# Patient Record
Sex: Female | Born: 1960 | Race: Black or African American | Hispanic: No | Marital: Married | Smoking: Current every day smoker
Health system: Southern US, Community
[De-identification: ages and names within clinical notes are randomized; demographics above are authoritative.]

## PROBLEM LIST (undated history)

## (undated) DIAGNOSIS — M5136 Other intervertebral disc degeneration, lumbar region: Secondary | ICD-10-CM

## (undated) DIAGNOSIS — M51369 Other intervertebral disc degeneration, lumbar region without mention of lumbar back pain or lower extremity pain: Secondary | ICD-10-CM

## (undated) DIAGNOSIS — F32A Depression, unspecified: Secondary | ICD-10-CM

## (undated) DIAGNOSIS — M1990 Unspecified osteoarthritis, unspecified site: Secondary | ICD-10-CM

## (undated) DIAGNOSIS — G43909 Migraine, unspecified, not intractable, without status migrainosus: Secondary | ICD-10-CM

## (undated) DIAGNOSIS — R609 Edema, unspecified: Secondary | ICD-10-CM

## (undated) DIAGNOSIS — I10 Essential (primary) hypertension: Secondary | ICD-10-CM

## (undated) DIAGNOSIS — F329 Major depressive disorder, single episode, unspecified: Secondary | ICD-10-CM

## (undated) DIAGNOSIS — J189 Pneumonia, unspecified organism: Secondary | ICD-10-CM

## (undated) DIAGNOSIS — J45909 Unspecified asthma, uncomplicated: Secondary | ICD-10-CM

## (undated) DIAGNOSIS — Z87442 Personal history of urinary calculi: Secondary | ICD-10-CM

## (undated) DIAGNOSIS — G709 Myoneural disorder, unspecified: Secondary | ICD-10-CM

## (undated) HISTORY — PX: ABDOMINAL HYSTERECTOMY: SHX81

## (undated) HISTORY — DX: Myoneural disorder, unspecified: G70.9

## (undated) HISTORY — DX: Other intervertebral disc degeneration, lumbar region: M51.36

## (undated) HISTORY — DX: Other intervertebral disc degeneration, lumbar region without mention of lumbar back pain or lower extremity pain: M51.369

## (undated) HISTORY — PX: LITHOTRIPSY: SUR834

## (undated) HISTORY — DX: Migraine, unspecified, not intractable, without status migrainosus: G43.909

## (undated) HISTORY — DX: Unspecified osteoarthritis, unspecified site: M19.90

## (undated) HISTORY — PX: OTHER SURGICAL HISTORY: SHX169

---

## 1996-11-21 HISTORY — PX: TUBAL LIGATION: SHX77

## 1998-11-21 DIAGNOSIS — Z87442 Personal history of urinary calculi: Secondary | ICD-10-CM

## 1998-11-21 HISTORY — DX: Personal history of urinary calculi: Z87.442

## 2005-05-19 ENCOUNTER — Ambulatory Visit: Payer: Self-pay | Admitting: Unknown Physician Specialty

## 2007-07-09 ENCOUNTER — Emergency Department: Payer: Self-pay | Admitting: Emergency Medicine

## 2007-12-13 ENCOUNTER — Emergency Department: Payer: Self-pay | Admitting: Emergency Medicine

## 2008-01-29 ENCOUNTER — Emergency Department: Payer: Self-pay | Admitting: Internal Medicine

## 2008-06-26 ENCOUNTER — Ambulatory Visit: Payer: Self-pay | Admitting: Internal Medicine

## 2010-11-16 ENCOUNTER — Ambulatory Visit: Payer: Self-pay | Admitting: Internal Medicine

## 2012-03-05 ENCOUNTER — Ambulatory Visit: Payer: Self-pay | Admitting: Obstetrics and Gynecology

## 2012-09-24 ENCOUNTER — Ambulatory Visit: Payer: Self-pay | Admitting: Urology

## 2013-02-28 ENCOUNTER — Ambulatory Visit: Payer: Self-pay | Admitting: Family Medicine

## 2013-06-17 ENCOUNTER — Emergency Department: Payer: Self-pay | Admitting: Emergency Medicine

## 2013-06-17 LAB — COMPREHENSIVE METABOLIC PANEL
Calcium, Total: 9.8 mg/dL (ref 8.5–10.1)
Chloride: 104 mmol/L (ref 98–107)
Creatinine: 1.03 mg/dL (ref 0.60–1.30)
EGFR (African American): >60
Glucose: 114 mg/dL — ABNORMAL HIGH (ref 65–99)
Potassium: 4 mmol/L (ref 3.5–5.1)
SGOT(AST): 35 U/L (ref 15–37)

## 2013-06-17 LAB — LIPASE, BLOOD: Lipase: 154 U/L (ref 73–393)

## 2013-06-17 LAB — URINALYSIS, COMPLETE
Bilirubin,UR: NEGATIVE
Ketone: NEGATIVE
Leukocyte Esterase: NEGATIVE
Nitrite: NEGATIVE
Ph: 5 (ref 4.5–8.0)
Specific Gravity: 1.012 (ref 1.003–1.030)
Squamous Epithelial: 4
WBC UR: 4 /HPF (ref 0–5)

## 2013-06-17 LAB — CBC
HCT: 39.3 % (ref 35.0–47.0)
MCH: 30.2 pg (ref 26.0–34.0)
MCV: 91 fL (ref 80–100)
Platelet: 302 10*3/uL (ref 150–440)
RBC: 4.31 10*6/uL (ref 3.80–5.20)

## 2013-06-18 LAB — URINE CULTURE

## 2013-10-30 ENCOUNTER — Ambulatory Visit: Payer: Self-pay | Admitting: Internal Medicine

## 2013-11-12 ENCOUNTER — Ambulatory Visit: Payer: Self-pay | Admitting: Internal Medicine

## 2014-04-25 ENCOUNTER — Emergency Department: Payer: Self-pay | Admitting: Emergency Medicine

## 2014-04-25 LAB — URINALYSIS, COMPLETE
Bilirubin,UR: NEGATIVE
Blood: NEGATIVE
Glucose,UR: NEGATIVE mg/dL (ref 0–75)
Ketone: NEGATIVE
LEUKOCYTE ESTERASE: NEGATIVE
Nitrite: NEGATIVE
Ph: 6 (ref 4.5–8.0)
Protein: 30
RBC,UR: 1 /HPF (ref 0–5)
SPECIFIC GRAVITY: 1.012 (ref 1.003–1.030)
WBC UR: 3 /HPF (ref 0–5)

## 2014-08-11 ENCOUNTER — Emergency Department: Payer: Self-pay | Admitting: General Practice

## 2014-12-25 ENCOUNTER — Ambulatory Visit: Payer: Self-pay | Admitting: Internal Medicine

## 2016-09-12 ENCOUNTER — Other Ambulatory Visit: Payer: Self-pay | Admitting: Internal Medicine

## 2016-09-12 DIAGNOSIS — Z1231 Encounter for screening mammogram for malignant neoplasm of breast: Secondary | ICD-10-CM

## 2016-10-21 ENCOUNTER — Ambulatory Visit: Payer: Self-pay

## 2016-11-25 ENCOUNTER — Ambulatory Visit: Payer: Self-pay | Attending: Internal Medicine

## 2016-12-28 ENCOUNTER — Encounter: Payer: Self-pay | Admitting: Radiology

## 2016-12-28 ENCOUNTER — Ambulatory Visit
Admission: RE | Admit: 2016-12-28 | Discharge: 2016-12-28 | Disposition: A | Discharge status: Home / Self Care | Payer: 59 | Source: Ambulatory Visit | Attending: Internal Medicine | Admitting: Internal Medicine

## 2016-12-28 DIAGNOSIS — R928 Other abnormal and inconclusive findings on diagnostic imaging of breast: Secondary | ICD-10-CM | POA: Diagnosis not present

## 2016-12-28 DIAGNOSIS — Z1231 Encounter for screening mammogram for malignant neoplasm of breast: Secondary | ICD-10-CM | POA: Diagnosis not present

## 2017-01-02 ENCOUNTER — Other Ambulatory Visit: Payer: Self-pay | Admitting: Internal Medicine

## 2017-01-02 DIAGNOSIS — R928 Other abnormal and inconclusive findings on diagnostic imaging of breast: Secondary | ICD-10-CM

## 2017-01-02 DIAGNOSIS — N6489 Other specified disorders of breast: Secondary | ICD-10-CM

## 2017-01-12 ENCOUNTER — Ambulatory Visit
Admission: RE | Admit: 2017-01-12 | Discharge: 2017-01-12 | Disposition: A | Discharge status: Home / Self Care | Payer: 59 | Source: Ambulatory Visit | Attending: Internal Medicine | Admitting: Internal Medicine

## 2017-01-12 DIAGNOSIS — R928 Other abnormal and inconclusive findings on diagnostic imaging of breast: Secondary | ICD-10-CM

## 2017-01-12 DIAGNOSIS — N6489 Other specified disorders of breast: Secondary | ICD-10-CM

## 2017-01-13 ENCOUNTER — Ambulatory Visit: Payer: 59

## 2017-01-13 ENCOUNTER — Other Ambulatory Visit: Payer: 59

## 2017-01-24 ENCOUNTER — Other Ambulatory Visit: Payer: Self-pay | Admitting: Physical Medicine and Rehabilitation

## 2017-01-24 DIAGNOSIS — M5416 Radiculopathy, lumbar region: Secondary | ICD-10-CM

## 2017-01-24 DIAGNOSIS — M51369 Other intervertebral disc degeneration, lumbar region without mention of lumbar back pain or lower extremity pain: Secondary | ICD-10-CM

## 2017-01-24 DIAGNOSIS — M5136 Other intervertebral disc degeneration, lumbar region: Secondary | ICD-10-CM

## 2017-01-24 DIAGNOSIS — M48062 Spinal stenosis, lumbar region with neurogenic claudication: Secondary | ICD-10-CM

## 2017-02-07 ENCOUNTER — Ambulatory Visit
Admission: RE | Admit: 2017-02-07 | Discharge: 2017-02-07 | Disposition: A | Discharge status: Home / Self Care | Payer: Commercial Managed Care - HMO | Source: Ambulatory Visit | Attending: Physical Medicine and Rehabilitation | Admitting: Physical Medicine and Rehabilitation

## 2017-02-07 DIAGNOSIS — M5126 Other intervertebral disc displacement, lumbar region: Secondary | ICD-10-CM | POA: Diagnosis not present

## 2017-02-07 DIAGNOSIS — M5416 Radiculopathy, lumbar region: Secondary | ICD-10-CM | POA: Diagnosis present

## 2017-02-07 DIAGNOSIS — M5136 Other intervertebral disc degeneration, lumbar region: Secondary | ICD-10-CM | POA: Diagnosis present

## 2017-02-07 DIAGNOSIS — M5127 Other intervertebral disc displacement, lumbosacral region: Secondary | ICD-10-CM | POA: Insufficient documentation

## 2017-02-07 DIAGNOSIS — M48062 Spinal stenosis, lumbar region with neurogenic claudication: Secondary | ICD-10-CM | POA: Diagnosis present

## 2017-02-07 DIAGNOSIS — M51369 Other intervertebral disc degeneration, lumbar region without mention of lumbar back pain or lower extremity pain: Secondary | ICD-10-CM

## 2017-05-24 IMAGING — MR MR LUMBAR SPINE W/O CM
5 series · 38 of 48 positions shown · non-contrast
Comparison: None.

CLINICAL DATA: Low back pain.  Numbness in the left lateral leg

EXAM:
MRI LUMBAR SPINE WITHOUT CONTRAST
TECHNIQUE: Multiplanar, multisequence MR imaging of the lumbar spine was
performed. No intravenous contrast was administered.

[Series 2: T2 · sagittal · 4.0mm · 0.81mm/px · 6 of 15 slices shown (1 of 2)]
[im 1/15]
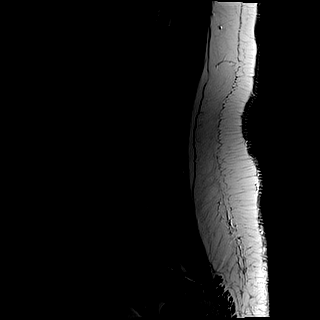
[im 3/15]
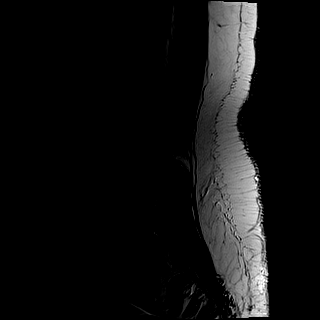
[im 6/15]
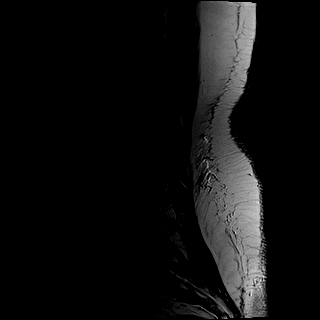
[im 9/15]
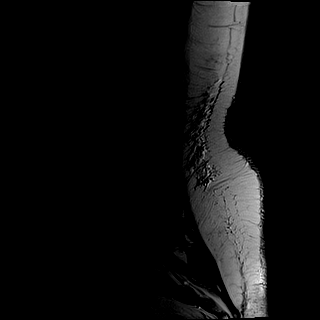
[im 12/15]
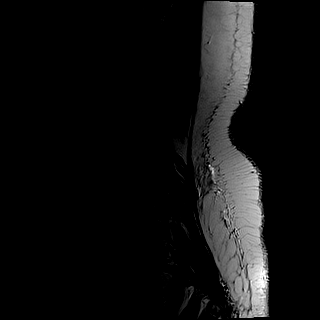
[im 15/15]
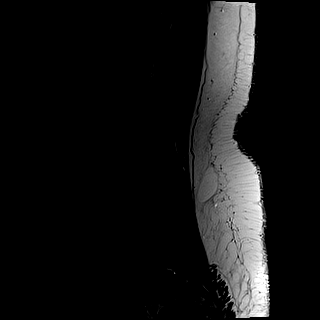

[Series 3: T1 · sagittal · 4.0mm · 0.81mm/px · 6 of 15 slices shown (1 of 2)]
[im 1/15]
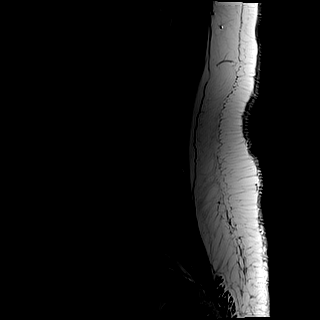
[im 3/15]
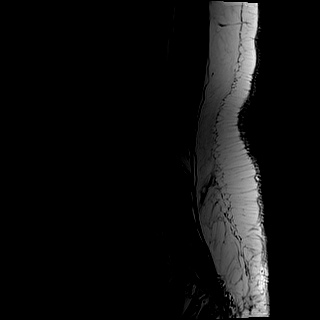
[im 6/15]
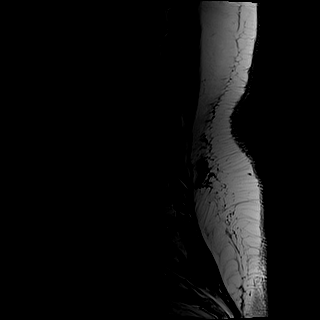
[im 9/15]
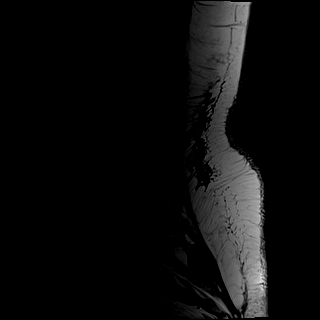
[im 12/15]
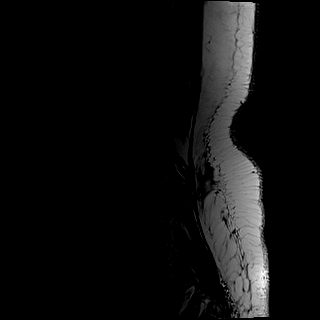
[im 15/15]
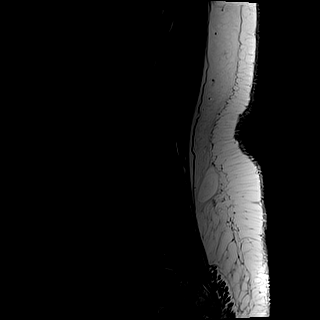

[Series 4: STIR · sagittal · 4.0mm · 1.02mm/px · 6 of 15 slices shown]
[im 1/15]
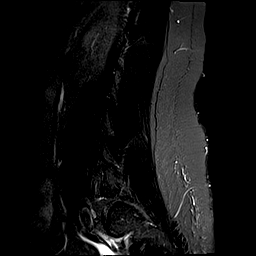
[im 3/15]
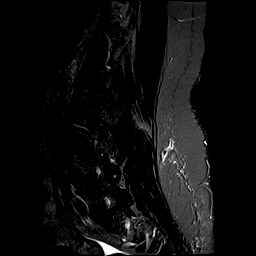
[im 6/15]
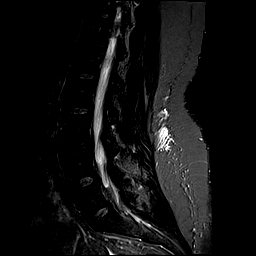
[im 9/15]
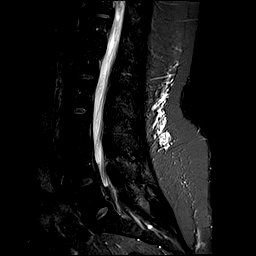
[im 12/15]
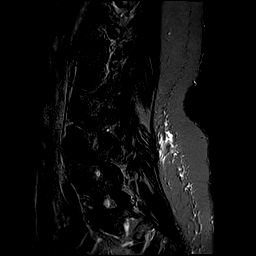
[im 15/15]
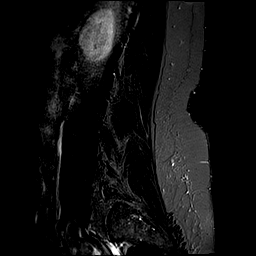

[Series 5: T2 · axial · 4.0mm · 0.78mm/px · z∈[-99,+126]mm · 11 of 40 slices shown (2 of 2)]
[im 1/40]
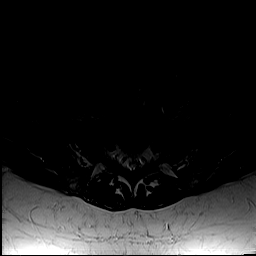
[im 3/40]
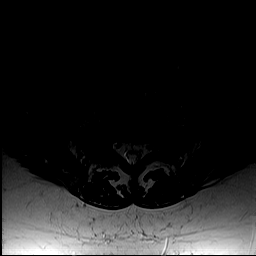
[im 6/40]
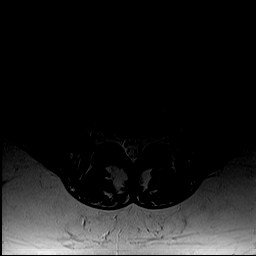
[im 9/40]
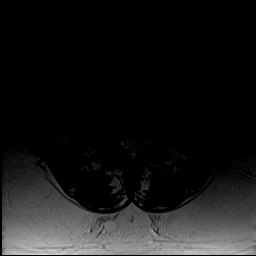
[im 12/40]
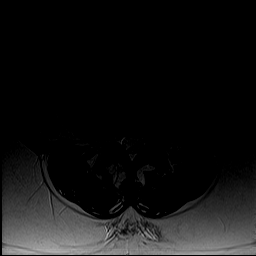
[im 17/40]
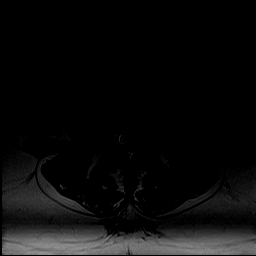
[im 20/40]
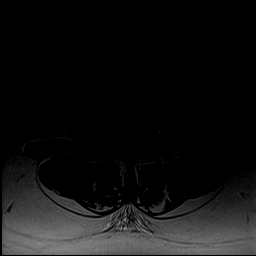
[im 23/40]
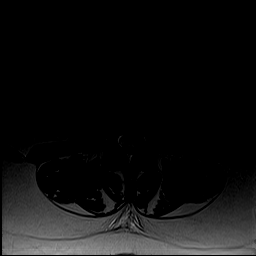
[im 28/40]
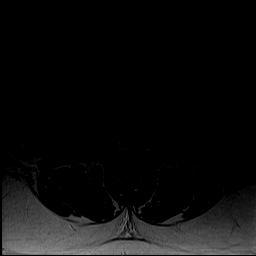
[im 34/40]
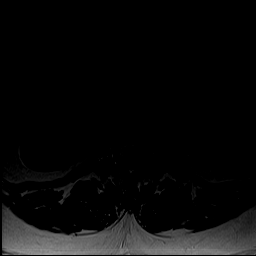
[im 40/40]
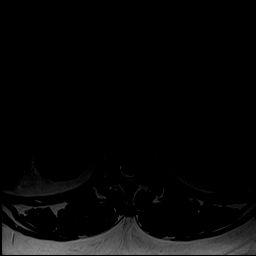

[Series 6: T1 · axial · 4.0mm · 0.39mm/px · z∈[-99,+126]mm · 9 of 40 slices shown (2 of 2)]
[im 1/40]
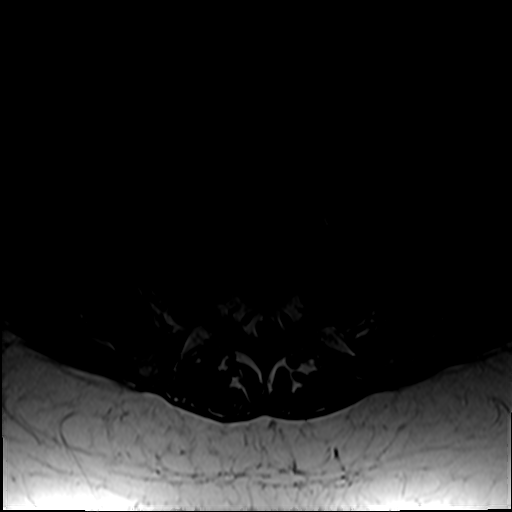
[im 6/40]
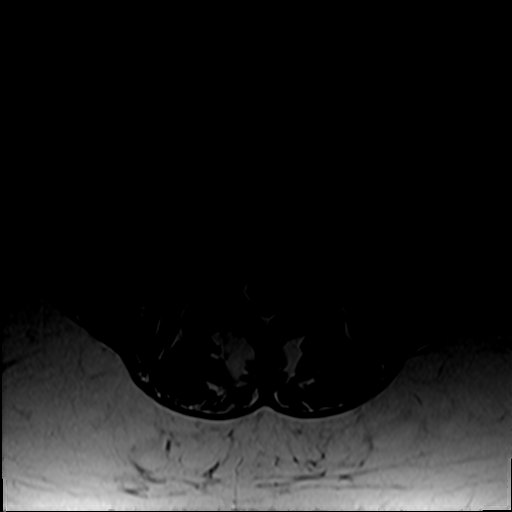
[im 12/40]
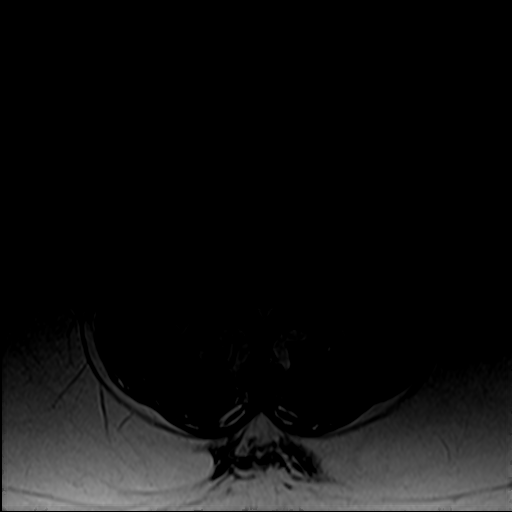
[im 17/40]
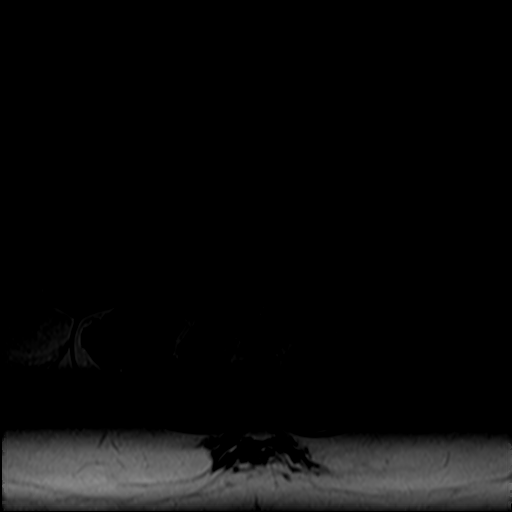
[im 20/40]
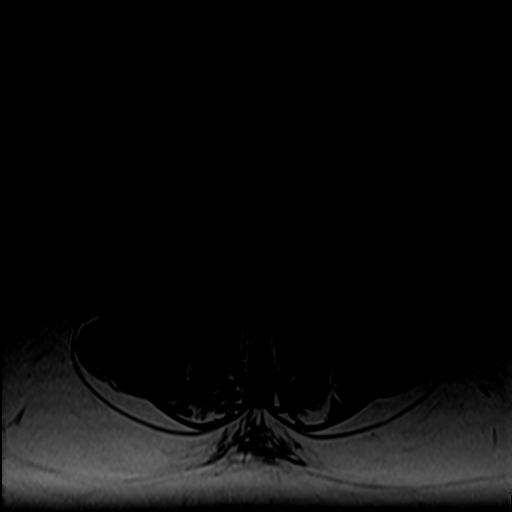
[im 23/40]
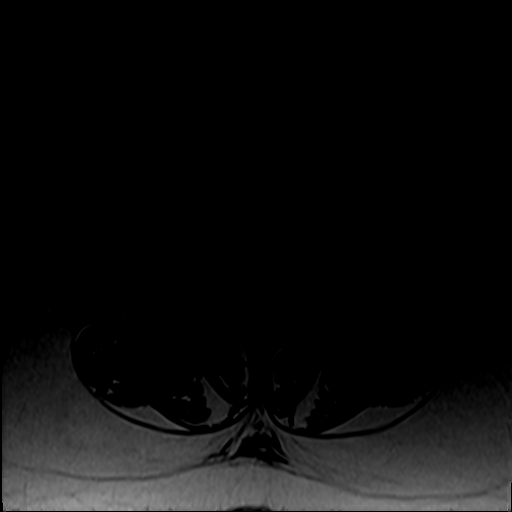
[im 28/40]
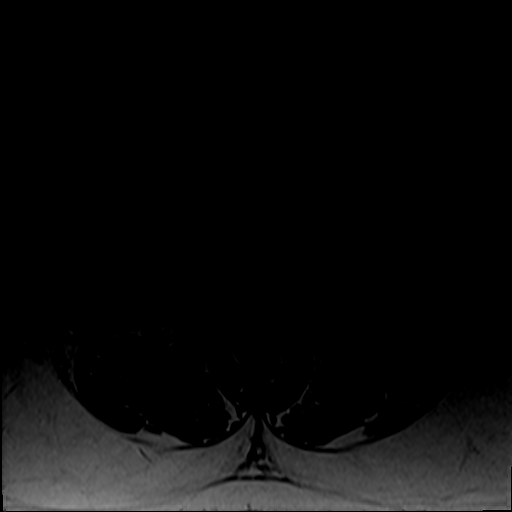
[im 34/40]
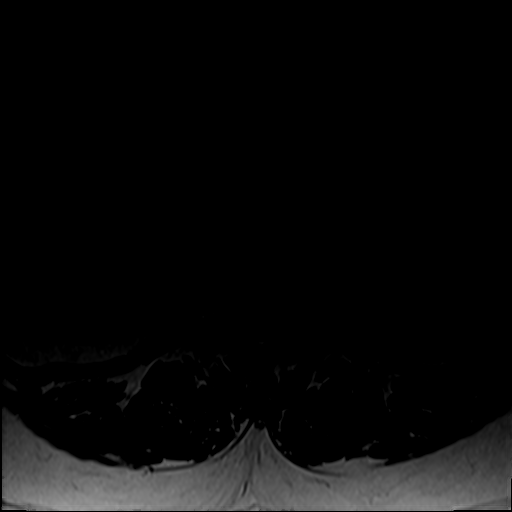
[im 40/40]
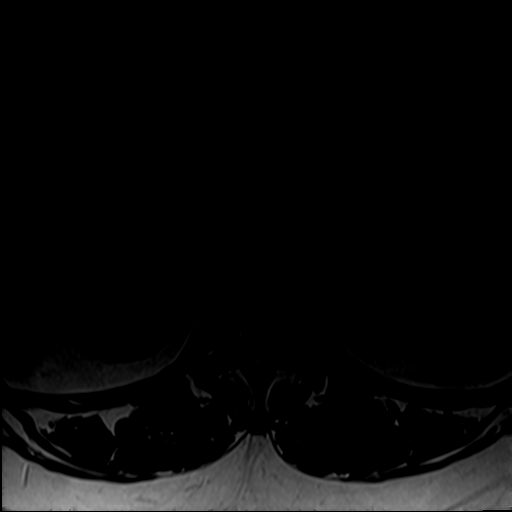

[38 of 48 positions shown; findings below may reference images not displayed]

FINDINGS: Segmentation:  Standard.

Alignment:  Physiologic.

Vertebrae:  No fracture, evidence of discitis, or bone lesion.

Conus medullaris: Extends to the L1 level and appears normal.

Paraspinal and other soft tissues: Negative.

Disc levels:

Disc spaces: Disc spaces are maintained.

T12-L1: Mild broad-based disc bulge. No evidence of neural foraminal
stenosis. No central canal stenosis.

L1-L2: No significant disc bulge. No evidence of neural foraminal
stenosis. No central canal stenosis.

L2-L3: No significant disc bulge. No evidence of neural foraminal
stenosis. No central canal stenosis.

L3-L4: No significant disc bulge. No evidence of neural foraminal
stenosis. No central canal stenosis. Mild bilateral facet
arthropathy.

L4-L5: Mild broad-based disc bulge. No evidence of neural foraminal
stenosis. No central canal stenosis.

L5-S1: Mild broad-based disc bulge. No evidence of neural foraminal
stenosis. No central canal stenosis.
IMPRESSION: 1. Mild broad-based disc bulges at L4-5 and L5-S1.
2. Mild bilateral facet arthropathy at L3-4.
3. No significant disc protrusion, foraminal stenosis or central
canal stenosis.

## 2017-12-14 ENCOUNTER — Other Ambulatory Visit: Payer: Self-pay | Admitting: Student

## 2017-12-14 ENCOUNTER — Other Ambulatory Visit
Admission: RE | Admit: 2017-12-14 | Discharge: 2017-12-14 | Disposition: A | Discharge status: Home / Self Care | Payer: Commercial Managed Care - HMO | Source: Ambulatory Visit | Attending: Student | Admitting: Student

## 2017-12-14 ENCOUNTER — Ambulatory Visit
Admission: RE | Admit: 2017-12-14 | Discharge: 2017-12-14 | Disposition: A | Discharge status: Home / Self Care | Payer: Commercial Managed Care - HMO | Source: Ambulatory Visit | Attending: Student | Admitting: Student

## 2017-12-14 DIAGNOSIS — R110 Nausea: Secondary | ICD-10-CM | POA: Diagnosis present

## 2017-12-14 DIAGNOSIS — N200 Calculus of kidney: Secondary | ICD-10-CM | POA: Diagnosis not present

## 2017-12-14 DIAGNOSIS — R1012 Left upper quadrant pain: Secondary | ICD-10-CM

## 2017-12-14 DIAGNOSIS — R1084 Generalized abdominal pain: Secondary | ICD-10-CM

## 2017-12-14 DIAGNOSIS — R1900 Intra-abdominal and pelvic swelling, mass and lump, unspecified site: Secondary | ICD-10-CM | POA: Diagnosis not present

## 2017-12-14 HISTORY — DX: Essential (primary) hypertension: I10

## 2017-12-14 HISTORY — DX: Unspecified asthma, uncomplicated: J45.909

## 2017-12-14 LAB — AMYLASE: AMYLASE: 64 U/L (ref 28–100)

## 2017-12-14 MED ORDER — IOPAMIDOL (ISOVUE-300) INJECTION 61%
100.0000 mL | Freq: Once | INTRAVENOUS | Status: AC | PRN
  Administered 2017-12-14: 100 mL via INTRAVENOUS

## 2017-12-15 ENCOUNTER — Telehealth: Payer: Self-pay

## 2017-12-15 DIAGNOSIS — R1900 Intra-abdominal and pelvic swelling, mass and lump, unspecified site: Secondary | ICD-10-CM

## 2017-12-15 NOTE — Telephone Encounter (Signed)
Received call from Amber at [facility]. They would like to refer Ms. Bitterman for abnormal CT scan with pelvic masses. Appointment arranged with Gyn Onc for 1/30 at 1400. They will notify Ms. Jaquith of this appointment as they are about to see in their clinic and give her the results of Ct. Requested Ca125 and HE4 be drawn today if possible.  Oncology Nurse Navigator Documentation  Navigator Location: [facility] (12/15/17 1300)   )Navigator Encounter Type: Telephone;Diagnostic Results (12/15/17 1300) Telephone: Incoming Call;Diagnostic Results (12/15/17 1300) Abnormal Finding Date: 12/14/17 (12/15/17 1300)             Multidisiplinary Clinic Type: GYN (12/15/17 1300)   Patient Visit Type: GynOnc (12/15/17 1300) Treatment Phase: Abnormal Scans (12/15/17 1300) Barriers/Navigation Needs: Coordination of Care (12/15/17 1300)   Interventions: Coordination of Care;Referrals (12/15/17 1300)   Coordination of Care: Appts (12/15/17 1300)                  Time Spent with Patient: 15 (12/15/17 1300)

## 2017-12-20 ENCOUNTER — Inpatient Hospital Stay: Payer: 59 | Attending: Obstetrics and Gynecology | Admitting: Obstetrics and Gynecology

## 2017-12-20 VITALS — BP 141/84 | HR 67 | Temp 96.9°F | Ht 63.0 in | Wt 191.5 lb

## 2017-12-20 DIAGNOSIS — R110 Nausea: Secondary | ICD-10-CM

## 2017-12-20 DIAGNOSIS — Z79899 Other long term (current) drug therapy: Secondary | ICD-10-CM | POA: Insufficient documentation

## 2017-12-20 DIAGNOSIS — R1900 Intra-abdominal and pelvic swelling, mass and lump, unspecified site: Secondary | ICD-10-CM | POA: Insufficient documentation

## 2017-12-20 DIAGNOSIS — Z7982 Long term (current) use of aspirin: Secondary | ICD-10-CM | POA: Diagnosis not present

## 2017-12-20 DIAGNOSIS — N189 Chronic kidney disease, unspecified: Secondary | ICD-10-CM

## 2017-12-20 DIAGNOSIS — J45909 Unspecified asthma, uncomplicated: Secondary | ICD-10-CM | POA: Insufficient documentation

## 2017-12-20 DIAGNOSIS — I129 Hypertensive chronic kidney disease with stage 1 through stage 4 chronic kidney disease, or unspecified chronic kidney disease: Secondary | ICD-10-CM | POA: Diagnosis not present

## 2017-12-20 DIAGNOSIS — Z90710 Acquired absence of both cervix and uterus: Secondary | ICD-10-CM

## 2017-12-20 DIAGNOSIS — N3289 Other specified disorders of bladder: Secondary | ICD-10-CM | POA: Diagnosis not present

## 2017-12-20 DIAGNOSIS — N200 Calculus of kidney: Secondary | ICD-10-CM | POA: Insufficient documentation

## 2017-12-20 DIAGNOSIS — F17210 Nicotine dependence, cigarettes, uncomplicated: Secondary | ICD-10-CM

## 2017-12-20 DIAGNOSIS — R109 Unspecified abdominal pain: Secondary | ICD-10-CM | POA: Insufficient documentation

## 2017-12-20 NOTE — Progress Notes (Signed)
Gynecologic Oncology Consult Visit   Referring Provider: Dr. Tammi Klippel  Chief Concern: pelvic mass, pain.  Subjective:  Sandra Carrillo is a 57 y.o. P2 female who is seen in consultation from Dr. Wynetta Emery for painful pelvic mass.  Referred from [facility].  Abdominal pain present now for about 4 months. Mostly on the left side. She was seen by primary care and given Cipro, Flagyl for possible diverticulitis. She did see improvement but not total resolution. Urinalysis also showed UTI. She has had some intermittent nausea but no vomiting. Bowels are sluggish at times. Occasional diarrhea. Occasional back pain as well. No clear fevers or chills. Labs last month were within normal limits. Denies any urinary symptoms other than difficulty emptying at times.  CT scan showed   CA125 = 7.8, CA19-9 = 32 HE4 - pending  CT scan 12/14/17 IMPRESSION: -Large LEFT renal calculus 16 x 16 x 14 mm with mild enhancement of the walls of a slightly prominent LEFT renal pelvis question related to urinary tract infection; recommend correlation with urinalysis. -Complicated intermediate attenuation mass in the pelvis 8.0 x 6.0 x 5.6 cm in size, likely of LEFT ovarian origin, question ovarian neoplasm; further assessment by transabdominal and transvaginal sonography of the pelvis recommended to exclude neoplasm. Suspected LEFT hydrosalpinx. -Indeterminate 1.8 x 1.5 cm splenic lesion; followup characterization by MR recommended. -Omitted from the initial dictation is presence of a calcification and questionable area of wall thickening at the bladder base; on sagittal views, is of uncertain whether this represents indentation of the bladder by adjacent soft tissue or a small mass 19 x 9 x 12 mm at the inferior bladder wall. Followup cystoscopic evaluation recommended to exclude tumor at bladder base.  She had lithotripsy and surgery about 15+ years ago at [facility] for kidney stones.  No problems since  then.  Problem List: There are no active problems to display for this patient.   Past Medical History: Past Medical History:  Diagnosis Date  . Arthritis   . Asthma   . Chronic kidney disease    kidney stones  . Degenerative disc disease, lumbar   . Hypertension   . Migraine     Past Surgical History: Past Surgical History:  Procedure Laterality Date  .  carpal tunnel surgery    . ABDOMINAL HYSTERECTOMY     partial 27 years ago  . ingrown toenail    . LITHOTRIPSY    . removal of fibroid    . TUBAL LIGATION      OB History:  OB History  No data available    Family History: Family History  Problem Relation Age of Onset  . Breast cancer Neg Hx     Social History: Social History   Socioeconomic History  . Marital status: Married    Spouse name: tony  . Number of children: 4  . Years of education: Not on file  . Highest education level: Not on file  Social Needs  . Financial resource strain: Not on file  . Food insecurity - worry: Not on file  . Food insecurity - inability: Not on file  . Transportation needs - medical: Not on file  . Transportation needs - non-medical: Not on file  Occupational History  . Not on file  Tobacco Use  . Smoking status: Current Every Day Smoker    Packs/day: 1.00  . Smokeless tobacco: Never Used  Substance and Sexual Activity  . Alcohol use: No    Frequency: Never  .  Drug use: Not on file  . Sexual activity: Not on file  Other Topics Concern  . Not on file  Social History Narrative  . Not on file    Allergies: Allergies  Allergen Reactions  . Lisinopril     Current Medications: Current Outpatient Medications  Medication Sig Dispense Refill  . amLODipine (NORVASC) 5 MG tablet Take 5 mg by mouth daily.  5  . aspirin EC 81 MG tablet Take 81 mg by mouth daily.    Marland Kitchen docusate sodium (COLACE) 100 MG capsule Take 100 mg by mouth 3 (three) times daily as needed.    . Fluticasone-Salmeterol (ADVAIR DISKUS) 100-50  MCG/DOSE AEPB Inhale 1 puff into the lungs 2 (two) times daily as needed.    . gabapentin (NEURONTIN) 300 MG capsule Take 300 mg by mouth 3 (three) times daily.    . Polyethylene Glycol 3350 (PEG 3350) POWD Take 17 g by mouth daily as needed.    . propranolol (INDERAL) 40 MG tablet Take 40 mg by mouth 2 (two) times daily.    Marland Kitchen torsemide (DEMADEX) 10 MG tablet Take 10 mg by mouth daily.    . traMADol (ULTRAM) 50 MG tablet Take 50 mg by mouth every 8 (eight) hours as needed. for pain  0   No current facility-administered medications for this visit.     Review of Systems General: negative for, fevers, chills, fatigue, changes in sleep, changes in weight or appetite Skin: negative for changes in color, texture, moles or lesions Eyes: negative for, changes in vision, pain, diplopia HEENT: negative for, change in hearing, pain, discharge, tinnitus, vertigo, voice changes, sore throat, neck masses Breasts: negative for breast lumps Pulmonary: negative for, dyspnea, orthopnea, productive cough Cardiac: negative for, palpitations, syncope, pain, discomfort, pressure Gastrointestinal: negative for, dysphagia, nausea, vomiting, jaundice, pain, constipation, diarrhea, hematemesis, hematochezia Genitourinary/Sexual: negative for, dysuria, discharge, hesitancy, nocturia, retention, stones, infections, STD's, incontinence Ob/Gyn: negative for, irregular bleeding Musculoskeletal: negative for, pain, stiffness, swelling, range of motion limitation Hematology: negative for, easy bruising, bleeding Neurologic/Psych: negative for, headaches, seizures, paralysis, weakness, tremor, change in gait, change in sensation, mood swings, depression, anxiety, change in memory  Objective:  Physical Examination:  BP (!) 141/84   Pulse 67   Temp (!) 96.9 F (36.1 C) (Tympanic)   Ht 5\' 3"  (1.6 m)   Wt 191 lb 8 oz (86.9 kg)   BMI 33.92 kg/m    ECOG Performance Status: 1 - Symptomatic but completely  ambulatory  General appearance: alert, cooperative and appears stated age HEENT:PERRLA, neck supple with midline trachea and thyroid without masses Lymph node survey: non-palpable, axillary, inguinal, supraclavicular Cardiovascular: regular rate and rhythm, no murmurs or gallops Respiratory: normal air entry, lungs clear to auscultation and no rales, rhonchi or wheezing Breast exam: not examined. Abdomen: non tender, slightly distended and tender in LLQ. Back: inspection of back is normal Extremities: extremities normal, atraumatic, no cyanosis or edema Skin exam - normal coloration and turgor, no rashes, no suspicious skin lesions noted. Neurological exam reveals alert, oriented, normal speech, no focal findings or movement disorder noted.  Pelvic: exam chaperoned by nurse, EGBUS within normal limits, normal vagina and vulva;   Bimanual: Hard mass in midline above vagina.  Rectal: confirms     Assessment:  Sandra Carrillo is a 57 y.o. female with pelvic pain and found on CT scan to have 8 cm pelvic mass, possible hydrosalpinx on CT scan.  Also has large stone in left renal pelvis. She had  lithotripsy and surgery about 15+ years ago at [facility] for kidney stones.  Possible thickening of bladder wall on CT scan.  The mass is very hard and just under the bladder anteriorly.  She has prior hysterectomy in remote past.   Medical co-morbidities complicating care: Marland Kitchen   Plan:   Problem List Items Addressed This Visit    None    Visit Diagnoses    Pelvic mass in female    -  Primary     We discussed options for management including laparoscopic surgery to remove the pelvic mass.  This is likely benign based on CT appearance and normal CA125, but she has a lot of pain. Prior to planning surgery will have the patient see Dr Erlene Quan in Urology to determine if any surgical intervention is indicated for kidney stone and bladder irregularity.  Will proceed with surgical planning after that,  The  patient's diagnosis, an outline of the further diagnostic and laboratory studies which will be required, the recommendation, and alternatives were discussed.  All questions were answered to the patient's satisfaction.  A total of 60 minutes were spent with the patient/family today; 40% was spent in education, counseling and coordination of care for pelvic mass.    Mellody Drown, MD  CC:  Kirk Ruths, MD [place] [facility] [place], [place] [address] [phone]

## 2017-12-20 NOTE — Progress Notes (Signed)
She has some left sides pain . She has kidney stone and has some in past. Chronic back pain and gets injections for it dr. Sharlet Salina.

## 2017-12-21 ENCOUNTER — Ambulatory Visit (INDEPENDENT_AMBULATORY_CARE_PROVIDER_SITE_OTHER): Payer: 59 | Admitting: Urology

## 2017-12-21 ENCOUNTER — Encounter: Payer: Self-pay | Admitting: Urology

## 2017-12-21 VITALS — BP 113/85 | HR 75 | Ht 63.0 in | Wt 191.0 lb

## 2017-12-21 DIAGNOSIS — R1900 Intra-abdominal and pelvic swelling, mass and lump, unspecified site: Secondary | ICD-10-CM | POA: Diagnosis not present

## 2017-12-21 DIAGNOSIS — D414 Neoplasm of uncertain behavior of bladder: Secondary | ICD-10-CM | POA: Diagnosis not present

## 2017-12-21 DIAGNOSIS — N3020 Other chronic cystitis without hematuria: Secondary | ICD-10-CM

## 2017-12-21 DIAGNOSIS — N200 Calculus of kidney: Secondary | ICD-10-CM

## 2017-12-21 LAB — URINALYSIS, COMPLETE
BILIRUBIN UA: NEGATIVE
Glucose, UA: NEGATIVE
Nitrite, UA: NEGATIVE
SPEC GRAV UA: 1.02 (ref 1.005–1.030)
Urobilinogen, Ur: 0.2 mg/dL (ref 0.2–1.0)
pH, UA: 7 (ref 5.0–7.5)

## 2017-12-21 LAB — MICROSCOPIC EXAMINATION

## 2017-12-21 MED ORDER — LIDOCAINE HCL 2 % EX GEL
1.0000 "application " | Freq: Once | CUTANEOUS | Status: AC
  Administered 2017-12-21: 1 via URETHRAL

## 2017-12-21 MED ORDER — CIPROFLOXACIN HCL 500 MG PO TABS
500.0000 mg | ORAL_TABLET | Freq: Once | ORAL | Status: AC
  Administered 2017-12-21: 500 mg via ORAL

## 2017-12-21 NOTE — Progress Notes (Signed)
   12/21/17  CC:  Chief Complaint  Patient presents with  . New Patient (Initial Visit)    HPI: See additional note for details  Cystoscopy Procedure Note  Patient identification was confirmed, informed consent was obtained, and patient was prepped using Betadine solution.  Lidocaine jelly was administered per urethral meatus.    Preoperative abx where received prior to procedure.    Procedure: - Flexible cystoscope introduced, without any difficulty.   - Thorough search of the bladder revealed:    normal urethral meatus    normal urothelium    no stones    no ulcers     no tumors    no urethral polyps    no trabeculation  On retroflexion, there is an approximately <1 cm bilobed submucosal mass appreciated situated at the posterior bladder neck directly adjacent throughout.  See photos attached to chart.  - Ureteral orifices were normal in position and appearance.  Post-Procedure: - Patient tolerated the procedure well   Hollice Espy, MD

## 2017-12-21 NOTE — Progress Notes (Signed)
12/21/2017 11:37 AM   Sandra Carrillo 12-13-1960 [id number]  Referring provider: Kirk Ruths, MD [facility] [place], [place] [address]  Chief Complaint  Patient presents with  . New Patient (Initial Visit)    HPI: 57 year old female referred by Dr. Fransisca Connors for further evaluation of a pelvic mass, possible bladder lesion, as well as a nonobstructing left lower pole kidney stone.  Patient was seen yesterday by GYN ONC after presenting with several months of left flank, left lower abdominal pain, weight loss, nausea and fatigue.  As part of her workup, she underwent CT abdomen and pelvis contrast on 12/14/2017 which revealed a large left adnexal mass in her left pelvis measuring 8 x 6 x 5.6 cm.  In addition to this, she was also found to have a lesion at the bladder base measuring 19 x 9 x 12 mm on the inferior wall unclear whether this was mass-effect from an external lesion.  Cystoscopic evaluation was recommended.  On physical exam, Dr. Fransisca Connors noted a hard mass in the midline above the vagina.  She has been treated on several occasions for presumed urinary tract infection over the past several months as well as antibiotics for diverticulitis without improvement in her pain.  Although her urinalyses have been somewhat suspicious, urine cultures grew only a small number of mixed flora.  She is also found to have a incidental large left lower pole renal calculus measuring 16 x 16 x 14 with mild urothelial enhancement of the left renal pelvis.  She does have a personal history of kidney stones including a history of failed shockwave lithotripsy followed by what sounds like possibly a PCNL greater than 15 years ago [facility] on the left.  She reports that the pain and discomfort she is experiencing currently is not at all similar to her kidney stone episode in the past.  This stone is new since 2014.    PMH: Past Medical History:  Diagnosis Date  .  Arthritis   . Asthma   . Chronic kidney disease    kidney stones  . Degenerative disc disease, lumbar   . Hypertension   . Migraine     Surgical History: Past Surgical History:  Procedure Laterality Date  .  carpal tunnel surgery    . ABDOMINAL HYSTERECTOMY     partial 27 years ago  . ingrown toenail    . LITHOTRIPSY    . removal of fibroid    . TUBAL LIGATION      Home Medications:  Allergies as of 12/21/2017      Reactions   Lisinopril    Sulfa Antibiotics       Medication List        Accurate as of 12/21/17 11:37 AM. Always use your most recent med list.          ADVAIR DISKUS 100-50 MCG/DOSE Aepb Generic drug:  Fluticasone-Salmeterol Inhale 1 puff into the lungs 2 (two) times daily as needed.   amLODipine 5 MG tablet Commonly known as:  NORVASC Take 5 mg by mouth daily.   aspirin EC 81 MG tablet Take 81 mg by mouth daily.   docusate sodium 100 MG capsule Commonly known as:  COLACE Take 100 mg by mouth 3 (three) times daily as needed.   gabapentin 300 MG capsule Commonly known as:  NEURONTIN Take 300 mg by mouth 3 (three) times daily.   PEG 3350 Powd Take 17 g by mouth daily as needed.  propranolol 40 MG tablet Commonly known as:  INDERAL Take 40 mg by mouth 2 (two) times daily.   torsemide 10 MG tablet Commonly known as:  DEMADEX Take 10 mg by mouth daily.   traMADol 50 MG tablet Commonly known as:  ULTRAM Take 50 mg by mouth every 8 (eight) hours as needed. for pain       Allergies:  Allergies  Allergen Reactions  . Lisinopril   . Sulfa Antibiotics     Family History: Family History  Problem Relation Age of Onset  . Breast cancer Neg Hx   . Kidney cancer Neg Hx   . Kidney disease Neg Hx   . Prostate cancer Neg Hx     Social History:  reports that she has been smoking.  She has been smoking about 1.00 pack per day. she has never used smokeless tobacco. She reports that she does not drink alcohol. Her drug history is not on  file.  ROS: UROLOGY Frequent Urination?: Yes Hard to postpone urination?: Yes Burning/pain with urination?: No Get up at night to urinate?: Yes Leakage of urine?: No Urine stream starts and stops?: Yes Trouble starting stream?: No Do you have to strain to urinate?: No Blood in urine?: No Urinary tract infection?: No Sexually transmitted disease?: No Injury to kidneys or bladder?: No Painful intercourse?: Yes Weak stream?: Yes Currently pregnant?: No Vaginal bleeding?: No Last menstrual period?: n  Gastrointestinal Nausea?: Yes Vomiting?: No Indigestion/heartburn?: Yes Diarrhea?: No Constipation?: Yes  Constitutional Fever: No Night sweats?: Yes Weight loss?: Yes Fatigue?: Yes  Skin Skin rash/lesions?: No Itching?: No  Eyes Blurred vision?: No Double vision?: No  Ears/Nose/Throat Sore throat?: Yes Sinus problems?: Yes  Hematologic/Lymphatic Swollen glands?: No Easy bruising?: No  Cardiovascular Leg swelling?: No Chest pain?: No  Respiratory Cough?: No Shortness of breath?: No  Endocrine Excessive thirst?: No  Musculoskeletal Back pain?: Yes Joint pain?: Yes  Neurological Headaches?: Yes Dizziness?: Yes  Psychologic Depression?: Yes Anxiety?: No  Physical Exam: BP 113/85   Pulse 75   Ht 5\' 3"  (1.6 m)   Wt 191 lb (86.6 kg)   BMI 33.83 kg/m   Constitutional:  Alert and oriented, No acute distress.  Accompanied by 72-year-old son today. HEENT: [place] AT, moist mucus membranes.  Trachea midline, no masses. Cardiovascular: No clubbing, cyanosis, or edema. Respiratory: Normal respiratory effort, no increased work of breathing. GI: Abdomen is soft, nontender, nondistended, no abdominal masses GU: Normal urethral meatus and external genitalia.  No significant CVA tenderness. Skin: No rashes, bruises or suspicious lesions. Neurologic: Grossly intact, no focal deficits, moving all 4 extremities. Psychiatric: Normal mood and affect.  Laboratory  Data: Lab Results  Component Value Date   WBC 7.6 06/17/2013   HGB 13.0 06/17/2013   HCT 39.3 06/17/2013   MCV 91 06/17/2013   PLT 302 06/17/2013    Lab Results  Component Value Date   CREATININE 1.03 06/17/2013    Urinalysis UA reviewed today, see epic.  1130 red blood cells.  Positive calcium oxalate crystals with many bacteria.  Pertinent Imaging: ADDENDUM REPORT: 12/14/2017 16:45  ADDENDUM: Omitted from the initial dictation is presence of a calcification and questionable area of wall thickening at the bladder base; on sagittal views, is of uncertain whether this represents indentation of the bladder by adjacent soft tissue or a small mass 19 x 9 x 12 mm at the inferior bladder wall. Followup cystoscopic evaluation recommended to exclude tumor at bladder base.   Electronically Signed  By: Lavonia Dana M.D.   On: 12/14/2017 16:45   Addended by Lavonia Dana, MD on 12/14/2017 4:47 PM    Study Result   CLINICAL DATA:  Upper abdominal pain for 2.5 months, nausea, constipation, history of diverticulitis, hysterectomy, asthma, hypertension, tubal ligation  EXAM: CT ABDOMEN AND PELVIS WITH CONTRAST  TECHNIQUE: Multidetector CT imaging of the abdomen and pelvis was performed using the standard protocol following bolus administration of intravenous contrast. Sagittal and coronal MPR images reconstructed from axial data set.  CONTRAST:  139mL ISOVUE-300 IOPAMIDOL (ISOVUE-300) INJECTION 61% IV. Dilute oral contrast.  COMPARISON:  None  FINDINGS: Lower chest: Lung bases clear  Hepatobiliary: Contracted gallbladder. Focal fatty infiltration of liver adjacent to falciform fissure. Remainder of liver normal appearance. No biliary dilatation.  Pancreas: Normal appearance  Spleen: Non-specific intermediate attenuation lesion within spleen 1.8 x 1.5 cm image 28, persists on delayed imaging, indeterminate etiology.  Adrenals/Urinary Tract: Adrenal  glands normal appearance. RIGHT kidney and ureter normal appearance. Large calculus at inferior pole of LEFT kidney 16 x 16 x 14 mm. Mild enhancement and thickening of the walls of the LEFT renal pelvis which could reflect infection. LEFT ureter and bladder unremarkable.  Stomach/Bowel: Appendix coiled adjacent to cecal tip. Stomach and bowel loops normal appearance.  Vascular/Lymphatic: Atherosclerotic calcifications aorta and iliac arteries without aneurysm. No adenopathy.  Reproductive: Uterus surgically absent. Tubular fluid attenuation structure in LEFT adnexa 1.9 x 1.6 cm in diameter extending 5.6 cm length question hydrosalpinx. Additionally, a complicated mass lesion is identified posterior to the urinary bladder extending slightly cranially, 8.0 x 6.0 x 5.6 cm, predominantly intermediate attenuation though there is a small slightly hyperdense nodule 17 mm diameter anteriorly to the LEFT. This could represent a LEFT ovarian neoplasm. RIGHT ovary is normal in size and morphology.  Other: Small amount of nonspecific free pelvic fluid. No free air. No hernia. No acute inflammatory process.  Musculoskeletal: Unremarkable  IMPRESSION: Large LEFT renal calculus 16 x 16 x 14 mm with mild enhancement of the walls of a slightly prominent LEFT renal pelvis question related to urinary tract infection; recommend correlation with urinalysis.  Complicated intermediate attenuation mass in the pelvis 8.0 x 6.0 x 5.6 cm in size, likely of LEFT ovarian origin, question ovarian neoplasm; further assessment by transabdominal and transvaginal sonography of the pelvis recommended to exclude neoplasm.  Suspected LEFT hydrosalpinx.  Indeterminate 1.8 x 1.5 cm splenic lesion; followup characterization by MR recommended.  These results will be called to the ordering clinician or representative by the Radiologist Assistant, and communication documented in the PACS or zVision  Dashboard.  Electronically Signed: By: Lavonia Dana M.D. On: 12/14/2017 15:15      Above CT scan was personally reviewed.  This was also compared directly to previous CT scan from 2014.  She had new interval development of stone material in her left lower pole.  She has an aberrant atypical vessel extending from the lower pole as well as a somewhat malrotated appearance.    Assessment & Plan:    1. Kidney stone on left side Left lower pole 16 x 16 x 14 mm left lower pole stone, new since 2014 She does have left-sided abdominal and back pain, but this may be related to left adnexal mass rather than stone as typically nonobstructing stones are not symptomatic After her adnexal mass is been addressed, I would recommend treatment for the stone given the risk of infection and the size of the stone Would consider PCNL versus  staged ureteroscopy down the road Slight urothelial enhancement likely related to chronic inflammation/irritation from the stone and is somewhat nonspecific - Urinalysis, Complete - CULTURE, URINE COMPREHENSIVE - ciprofloxacin (CIPRO) tablet 500 mg - lidocaine (XYLOCAINE) 2 % jelly 1 application  2. 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 Fransisca Connors on how to proceed with treatment of the pelvic mass/tissue biopsy  3. Chronic cystitis UA somewhat suspicious today, likely related to presence of stone creating chronic irritation/inflammation/colonization Urine culture today   4. Pelvic mass Defer to Dr. Javier Docker, MD  [facility] [address], [place], [place] [address] [phone]  Case was discussed with Dr. Fransisca Connors.  I spent 45 min with this patient of which greater than 50% was spent in  counseling and coordination of care with the patient.

## 2017-12-22 ENCOUNTER — Telehealth: Payer: Self-pay

## 2017-12-22 NOTE — Telephone Encounter (Signed)
Spoke with Sandra Carrillo. Per Dr. Fransisca Connors, she has seen Urology and we can arrange for removal of pelvic mass. She will come on 2/6 to discuss surgery further and consenting/teaching. Oncology Nurse Navigator Documentation  Navigator Location: [facility] (12/22/17 1300)   )Navigator Encounter Type: Telephone (12/22/17 1300) Telephone: Sandra Carrillo Call;Appt Confirmation/Clarification (12/22/17 1300)                                                  Time Spent with Patient: 15 (12/22/17 1300)

## 2017-12-25 LAB — CULTURE, URINE COMPREHENSIVE

## 2017-12-27 ENCOUNTER — Inpatient Hospital Stay: Payer: 59 | Attending: Internal Medicine | Admitting: Obstetrics and Gynecology

## 2017-12-27 ENCOUNTER — Encounter: Payer: Self-pay | Admitting: Obstetrics and Gynecology

## 2017-12-27 VITALS — BP 137/84 | HR 64 | Temp 95.5°F | Resp 18 | Ht 63.0 in | Wt 189.0 lb

## 2017-12-27 DIAGNOSIS — R1900 Intra-abdominal and pelvic swelling, mass and lump, unspecified site: Secondary | ICD-10-CM

## 2017-12-27 NOTE — Progress Notes (Signed)
Here to discuss surgery, still having pain

## 2017-12-27 NOTE — H&P (View-Only) (Signed)
Gynecologic Oncology Interval Visit   Referring Provider: Dr. Tammi Klippel  Chief Concern: pelvic mass, pain.  Subjective:  Sandra Carrillo is a 57 y.o. P2 female who is seen for discussion of surgery for painful pelvic mass.    In the interim, patient has seen Dr. Erlene Quan with Urology and undergone cystoscopy. < 1 cm bilobed submucosal mass situated at the posterior bladder neck was found. Thought to be extrinsic compression from known pelvic mass.   Today, patient reports continued fatigue and pelvic pain.  Gynecology-Oncology History:  Initially, patient was initially seen in consultation from Dr. Wynetta Emery at [place] for painful pelvic mass. She had had abdominal pain for approximately 4 months and was treated with Cipro and Flagyl for possible diverticulitis. She saw improvement in symptoms but no resolution. Urinalysis showed UTI. She had intermittent nausea w/o vomiting. Occassional alternating diarrhea and constipation as well as back pain. Some trouble emptying bladder. No fevers or chills. Reports recent labs were normal. in consultation from Dr. Wynetta Emery for painful pelvic mass.  CA125 = 7.8, CA19-9 = 32, HE4 - 94.1  CT scan 12/14/17 IMPRESSION: -Large LEFT renal calculus 16 x 16 x 14 mm with mild enhancement of the walls of a slightly prominent LEFT renal pelvis question related to urinary tract infection; recommend correlation with urinalysis. -Complicated intermediate attenuation mass in the pelvis 8.0 x 6.0 x 5.6 cm in size, likely of LEFT ovarian origin, question ovarian neoplasm; further assessment by transabdominal and transvaginal sonography of the pelvis recommended to exclude neoplasm. Suspected LEFT hydrosalpinx. -Indeterminate 1.8 x 1.5 cm splenic lesion; followup characterization by MR recommended. -Omitted from the initial dictation is presence of a calcification and questionable area of wall thickening at the bladder base; on sagittal views, is of  uncertain whether this represents indentation of the bladder by adjacent soft tissue or a small mass 19 x 9 x 12 mm at the inferior bladder wall. Followup cystoscopic evaluation recommended to exclude tumor at bladder base.  She had lithotripsy and surgery about 15+ years ago at [facility] for kidney stones.  No problems since then. Prior hysterectomy.   Problem List: There are no active problems to display for this patient.   Past Medical History: Past Medical History:  Diagnosis Date  . Arthritis   . Asthma   . Chronic kidney disease    kidney stones  . Degenerative disc disease, lumbar   . Hypertension   . Migraine     Past Surgical History: Past Surgical History:  Procedure Laterality Date  .  carpal tunnel surgery    . ABDOMINAL HYSTERECTOMY     partial 27 years ago  . ingrown toenail    . LITHOTRIPSY    . removal of fibroid    . TUBAL LIGATION      OB History:  OB History  No data available    Family History: Family History  Problem Relation Age of Onset  . Breast cancer Neg Hx   . Kidney cancer Neg Hx   . Kidney disease Neg Hx   . Prostate cancer Neg Hx     Social History: Social History   Socioeconomic History  . Marital status: Married    Spouse name: tony  . Number of children: 4  . Years of education: Not on file  . Highest education level: Not on file  Social Needs  . Financial resource strain: Not on file  . Food insecurity - worry: Not on file  . Food insecurity -  inability: Not on file  . Transportation needs - medical: Not on file  . Transportation needs - non-medical: Not on file  Occupational History  . Not on file  Tobacco Use  . Smoking status: Current Every Day Smoker    Packs/day: 1.00  . Smokeless tobacco: Never Used  Substance and Sexual Activity  . Alcohol use: No    Frequency: Never  . Drug use: Not on file  . Sexual activity: Not on file  Other Topics Concern  . Not on file  Social History Narrative  . Not on file     Allergies: Allergies  Allergen Reactions  . Lisinopril   . Sulfa Antibiotics     Current Medications: Current Outpatient Medications  Medication Sig Dispense Refill  . amLODipine (NORVASC) 5 MG tablet Take 5 mg by mouth daily.  5  . aspirin EC 81 MG tablet Take 81 mg by mouth daily.    Marland Kitchen docusate sodium (COLACE) 100 MG capsule Take 100 mg by mouth 3 (three) times daily as needed.    . Fluticasone-Salmeterol (ADVAIR DISKUS) 100-50 MCG/DOSE AEPB Inhale 1 puff into the lungs 2 (two) times daily as needed.    . gabapentin (NEURONTIN) 300 MG capsule Take 300 mg by mouth 3 (three) times daily.    . Polyethylene Glycol 3350 (PEG 3350) POWD Take 17 g by mouth daily as needed.    . propranolol (INDERAL) 40 MG tablet Take 40 mg by mouth 2 (two) times daily.    Marland Kitchen torsemide (DEMADEX) 10 MG tablet Take 10 mg by mouth daily.    . traMADol (ULTRAM) 50 MG tablet Take 50 mg by mouth every 8 (eight) hours as needed. for pain  0   No current facility-administered medications for this visit.     Review of Systems Review of Systems  Constitutional: Positive for malaise/fatigue.  HENT: Negative for congestion, ear discharge, ear pain, hearing loss, nosebleeds and tinnitus.   Eyes: Negative for blurred vision, double vision, photophobia and pain.  Respiratory: Negative for cough, hemoptysis, sputum production, shortness of breath and wheezing.   Cardiovascular: Positive for leg swelling. Negative for chest pain, palpitations, orthopnea, claudication and PND.  Gastrointestinal: Positive for abdominal pain, diarrhea, nausea and vomiting.       Decreased appetite  Genitourinary: Negative for dysuria, frequency and urgency.       Negative for discharge, STDs, incontinence. Positive for hesitancy and infection  Skin: Negative.   Neurological: Negative for dizziness, tingling, tremors, sensory change and headaches.  Endo/Heme/Allergies: Negative for environmental allergies. Does not bruise/bleed easily.   Psychiatric/Behavioral: Negative for depression. The patient is not nervous/anxious and does not have insomnia.   OB/GYN: positive for vulvar itching/irritation. Negative for irregular bleeding  Objective:  Physical Examination:  BP 137/84   Pulse 64   Temp (!) 95.5 F (35.3 C) (Tympanic)   Resp 18   Ht 5\' 3"  (1.6 m)   Wt 189 lb (85.7 kg)   BMI 33.48 kg/m    ECOG Performance Status: 1 - Symptomatic but completely ambulatory  General appearance: alert, cooperative and appears stated age HEENT:PERRLA, neck supple with midline trachea and thyroid without masses Lymph node survey: non-palpable, axillary, inguinal, supraclavicular Cardiovascular: regular rate and rhythm, no murmurs or gallops Respiratory: normal air entry, lungs clear to auscultation and no rales, rhonchi or wheezing Breast exam: not examined. Abdomen: non tender, slightly distended and tender in LLQ. Back: inspection of back is normal Extremities: extremities normal, atraumatic, no cyanosis or edema Skin  exam - normal coloration and turgor, no rashes, no suspicious skin lesions noted. Neurological exam reveals alert, oriented, normal speech, no focal findings or movement disorder noted. Pelvic: deferred     Assessment:  NALLELY YOST is a 57 y.o. female who was found to have 8 cm pelvic mass on CT with possible hydrosalpinx. Most likely this is benign with negative tumor markers and imaging shows no evidence of metastatic disease.  She does have significant pain and desires surgery. She has prior hysterectomy in remote past.   Dr. Erlene Quan with Urology did cystoscopy for < 1 cm bilobed submucosal mass situated at the posterior bladder neck. Thought to be extrinsic compression from known pelvic mass.  Dr. Erlene Quan recommended deferring treatment of stone until after surgery for adnexal mass given size of stone. Was given Cipro for UA and urine sent for culture.    Plan:   Problem List Items Addressed This Visit     None    Visit Diagnoses    Pelvic mass in female    -  Primary     We discussed options for management including laparoscopic surgery to remove the pelvic mass.  This is likely benign based on CT appearance and normal CA125, but she has a lot of pain. We discussed laparoscopic surgery.  If this is a hydrosalpinx she could just have salpingectomy with preservation of both ovaries.  However, after discussing the pros and cons, we decided that at age 28 it would be best to have BSO to avoid further surgery in the future.  Also discussed possible surgical staging including pelvic and aortic node biopsies if cancer found, but this is unlikely.  Surgery scheduled for 2/13 with Dr Theora Gianotti and she will do the case with robotic assistant.   Risks including but not limited to:   Bleeding - possibly requiring transfusion  Infection- possibly requiring antibiotics, drain placement, and/or opening of incision  Damage to nearby organs: bowel, bladder, blood vessels, nerves, ureters, uterus, vagina, cervix   Need for further surgery or re-exploration   Hernia or breakdown of incision  Delayed wound healing  Anesthesia risks  Medical complications: thromboembolic events (blood clot to lung, brain, legs), pneumonia, chronic pain, heart attack, stroke, death  Surgical menopause  She will have VTE prophylaxis with IPCs.  The patient's diagnosis, an outline of the further diagnostic and laboratory studies which will be required, the recommendation, and alternatives were discussed.  All questions were answered to the patient's satisfaction.  A total of 60 minutes were spent with the patient/family today; 40% was spent in education, counseling and coordination of care for pelvic mass.    Verlon Au, NP  CC:  Kirk Ruths, MD [facility] [place], [place] [address] [phone]

## 2017-12-27 NOTE — Progress Notes (Signed)
Gynecologic Oncology Interval Visit   Referring Provider: Dr. Tammi Klippel  Chief Concern: pelvic mass, pain.  Subjective:  Sandra Carrillo is a 57 y.o. P2 female who is seen for discussion of surgery for painful pelvic mass.    In the interim, patient has seen Dr. Erlene Quan with Urology and undergone cystoscopy. < 1 cm bilobed submucosal mass situated at the posterior bladder neck was found. Thought to be extrinsic compression from known pelvic mass.   Today, patient reports continued fatigue and pelvic pain.  Gynecology-Oncology History:  Initially, patient was initially seen in consultation from Dr. Wynetta Emery at [facility] for painful pelvic mass. She had had abdominal pain for approximately 4 months and was treated with Cipro and Flagyl for possible diverticulitis. She saw improvement in symptoms but no resolution. Urinalysis showed UTI. She had intermittent nausea w/o vomiting. Occassional alternating diarrhea and constipation as well as back pain. Some trouble emptying bladder. No fevers or chills. Reports recent labs were normal. in consultation from Dr. Wynetta Emery for painful pelvic mass.  CA125 = 7.8, CA19-9 = 32, HE4 - 94.1  CT scan 12/14/17 IMPRESSION: -Large LEFT renal calculus 16 x 16 x 14 mm with mild enhancement of the walls of a slightly prominent LEFT renal pelvis question related to urinary tract infection; recommend correlation with urinalysis. -Complicated intermediate attenuation mass in the pelvis 8.0 x 6.0 x 5.6 cm in size, likely of LEFT ovarian origin, question ovarian neoplasm; further assessment by transabdominal and transvaginal sonography of the pelvis recommended to exclude neoplasm. Suspected LEFT hydrosalpinx. -Indeterminate 1.8 x 1.5 cm splenic lesion; followup characterization by MR recommended. -Omitted from the initial dictation is presence of a calcification and questionable area of wall thickening at the bladder base; on sagittal views, is of  uncertain whether this represents indentation of the bladder by adjacent soft tissue or a small mass 19 x 9 x 12 mm at the inferior bladder wall. Followup cystoscopic evaluation recommended to exclude tumor at bladder base.  She had lithotripsy and surgery about 15+ years ago at [facility] for kidney stones.  No problems since then. Prior hysterectomy.   Problem List: There are no active problems to display for this patient.   Past Medical History: Past Medical History:  Diagnosis Date  . Arthritis   . Asthma   . Chronic kidney disease    kidney stones  . Degenerative disc disease, lumbar   . Hypertension   . Migraine     Past Surgical History: Past Surgical History:  Procedure Laterality Date  .  carpal tunnel surgery    . ABDOMINAL HYSTERECTOMY     partial 27 years ago  . ingrown toenail    . LITHOTRIPSY    . removal of fibroid    . TUBAL LIGATION      OB History:  OB History  No data available    Family History: Family History  Problem Relation Age of Onset  . Breast cancer Neg Hx   . Kidney cancer Neg Hx   . Kidney disease Neg Hx   . Prostate cancer Neg Hx     Social History: Social History   Socioeconomic History  . Marital status: Married    Spouse name: tony  . Number of children: 4  . Years of education: Not on file  . Highest education level: Not on file  Social Needs  . Financial resource strain: Not on file  . Food insecurity - worry: Not on file  . Food insecurity -  inability: Not on file  . Transportation needs - medical: Not on file  . Transportation needs - non-medical: Not on file  Occupational History  . Not on file  Tobacco Use  . Smoking status: Current Every Day Smoker    Packs/day: 1.00  . Smokeless tobacco: Never Used  Substance and Sexual Activity  . Alcohol use: No    Frequency: Never  . Drug use: Not on file  . Sexual activity: Not on file  Other Topics Concern  . Not on file  Social History Narrative  . Not on file     Allergies: Allergies  Allergen Reactions  . Lisinopril   . Sulfa Antibiotics     Current Medications: Current Outpatient Medications  Medication Sig Dispense Refill  . amLODipine (NORVASC) 5 MG tablet Take 5 mg by mouth daily.  5  . aspirin EC 81 MG tablet Take 81 mg by mouth daily.    Marland Kitchen docusate sodium (COLACE) 100 MG capsule Take 100 mg by mouth 3 (three) times daily as needed.    . Fluticasone-Salmeterol (ADVAIR DISKUS) 100-50 MCG/DOSE AEPB Inhale 1 puff into the lungs 2 (two) times daily as needed.    . gabapentin (NEURONTIN) 300 MG capsule Take 300 mg by mouth 3 (three) times daily.    . Polyethylene Glycol 3350 (PEG 3350) POWD Take 17 g by mouth daily as needed.    . propranolol (INDERAL) 40 MG tablet Take 40 mg by mouth 2 (two) times daily.    Marland Kitchen torsemide (DEMADEX) 10 MG tablet Take 10 mg by mouth daily.    . traMADol (ULTRAM) 50 MG tablet Take 50 mg by mouth every 8 (eight) hours as needed. for pain  0   No current facility-administered medications for this visit.     Review of Systems Review of Systems  Constitutional: Positive for malaise/fatigue.  HENT: Negative for congestion, ear discharge, ear pain, hearing loss, nosebleeds and tinnitus.   Eyes: Negative for blurred vision, double vision, photophobia and pain.  Respiratory: Negative for cough, hemoptysis, sputum production, shortness of breath and wheezing.   Cardiovascular: Positive for leg swelling. Negative for chest pain, palpitations, orthopnea, claudication and PND.  Gastrointestinal: Positive for abdominal pain, diarrhea, nausea and vomiting.       Decreased appetite  Genitourinary: Negative for dysuria, frequency and urgency.       Negative for discharge, STDs, incontinence. Positive for hesitancy and infection  Skin: Negative.   Neurological: Negative for dizziness, tingling, tremors, sensory change and headaches.  Endo/Heme/Allergies: Negative for environmental allergies. Does not bruise/bleed easily.   Psychiatric/Behavioral: Negative for depression. The patient is not nervous/anxious and does not have insomnia.   OB/GYN: positive for vulvar itching/irritation. Negative for irregular bleeding  Objective:  Physical Examination:  BP 137/84   Pulse 64   Temp (!) 95.5 F (35.3 C) (Tympanic)   Resp 18   Ht 5\' 3"  (1.6 m)   Wt 189 lb (85.7 kg)   BMI 33.48 kg/m    ECOG Performance Status: 1 - Symptomatic but completely ambulatory  General appearance: alert, cooperative and appears stated age HEENT:PERRLA, neck supple with midline trachea and thyroid without masses Lymph node survey: non-palpable, axillary, inguinal, supraclavicular Cardiovascular: regular rate and rhythm, no murmurs or gallops Respiratory: normal air entry, lungs clear to auscultation and no rales, rhonchi or wheezing Breast exam: not examined. Abdomen: non tender, slightly distended and tender in LLQ. Back: inspection of back is normal Extremities: extremities normal, atraumatic, no cyanosis or edema Skin  exam - normal coloration and turgor, no rashes, no suspicious skin lesions noted. Neurological exam reveals alert, oriented, normal speech, no focal findings or movement disorder noted. Pelvic: deferred     Assessment:  Sandra Carrillo is a 57 y.o. female who was found to have 8 cm pelvic mass on CT with possible hydrosalpinx. Most likely this is benign with negative tumor markers and imaging shows no evidence of metastatic disease.  She does have significant pain and desires surgery. She has prior hysterectomy in remote past.   Dr. Erlene Quan with Urology did cystoscopy for < 1 cm bilobed submucosal mass situated at the posterior bladder neck. Thought to be extrinsic compression from known pelvic mass.  Dr. Erlene Quan recommended deferring treatment of stone until after surgery for adnexal mass given size of stone. Was given Cipro for UA and urine sent for culture.    Plan:   Problem List Items Addressed This Visit     None    Visit Diagnoses    Pelvic mass in female    -  Primary     We discussed options for management including laparoscopic surgery to remove the pelvic mass.  This is likely benign based on CT appearance and normal CA125, but she has a lot of pain. We discussed laparoscopic surgery.  If this is a hydrosalpinx she could just have salpingectomy with preservation of both ovaries.  However, after discussing the pros and cons, we decided that at age 23 it would be best to have BSO to avoid further surgery in the future.  Also discussed possible surgical staging including pelvic and aortic node biopsies if cancer found, but this is unlikely.  Surgery scheduled for 2/13 with Dr Theora Gianotti and she will do the case with robotic assistant.   Risks including but not limited to:   Bleeding - possibly requiring transfusion  Infection- possibly requiring antibiotics, drain placement, and/or opening of incision  Damage to nearby organs: bowel, bladder, blood vessels, nerves, ureters, uterus, vagina, cervix   Need for further surgery or re-exploration   Hernia or breakdown of incision  Delayed wound healing  Anesthesia risks  Medical complications: thromboembolic events (blood clot to lung, brain, legs), pneumonia, chronic pain, heart attack, stroke, death  Surgical menopause  She will have VTE prophylaxis with IPCs.  The patient's diagnosis, an outline of the further diagnostic and laboratory studies which will be required, the recommendation, and alternatives were discussed.  All questions were answered to the patient's satisfaction.  A total of 60 minutes were spent with the patient/family today; 40% was spent in education, counseling and coordination of care for pelvic mass.    Verlon Au, NP  CC:  Kirk Ruths, MD [facility] [place], [place] [address] [phone]

## 2017-12-27 NOTE — Patient Instructions (Signed)
Bilateral Salpingo-Oophorectomy, Care After This sheet gives you information about how to care for yourself after your procedure. Your health care provider may also give you more specific instructions. If you have problems or questions, contact your health care provider. What can I expect after the procedure? After the procedure, it is common to have:  Abdominal pain.  Some occasional vaginal bleeding (spotting).  Tiredness.  Symptoms of menopause, such as hot flashes, night sweats, or mood swings.  Follow these instructions at home: Incision care  Keep your incision area and your bandage (dressing) clean and dry.  Follow instructions from your health care provider about how to take care of your incision. Make sure you: ? Wash your hands with soap and water before you change your dressing. If soap and water are not available, use hand sanitizer. ? Change your dressing as told by your health care provider. ? Leave stitches (sutures), staples, skin glue, or adhesive strips in place. These skin closures may need to stay in place for 2 weeks or longer. If adhesive strip edges start to loosen and curl up, you may trim the loose edges. Do not remove adhesive strips completely unless your health care provider tells you to do that.  Check your incision area every day for signs of infection. Check for: ? Redness, swelling, or pain. ? Fluid or blood. ? Warmth. ? Pus or a bad smell. Activity  Do not drive or use heavy machinery while taking prescription pain medicine.  Do not drive for 24 hours if you received a medicine to help you relax (sedative) during your procedure.  Take frequent, short walks throughout the day. Rest when you get tired. Ask your health care provider what activities are safe for you.  Avoid activity that requires great effort. Also, avoid heavy lifting. Do not lift anything that is heavier than 10 lbs. (4.5 kg), or the limit that your health care provider tells you,  until he or she says that it is safe to do so.  Do not douche, use tampons, or have sex until your health care provider approves. General instructions  To prevent or treat constipation while you are taking prescription pain medicine, your health care provider may recommend that you: ? Drink enough fluid to keep your urine clear or pale yellow. ? Take over-the-counter or prescription medicines. ? Eat foods that are high in fiber, such as fresh fruits and vegetables, whole grains, and beans. ? Limit foods that are high in fat and processed sugars, such as fried and sweet foods.  Take over-the-counter and prescription medicines only as told by your health care provider.  Do not take baths, swim, or use a hot tub until your health care provider approves. Ask your health care provider if you can take showers. You may only be allowed to take sponge baths for bathing.  Wear compression stockings as told by your health care provider. These stockings help to prevent blood clots and reduce swelling in your legs.  Keep all follow-up visits as told by your health care provider. This is important. Contact a health care provider if:  You have pain when you urinate.  You have pus or a bad smelling discharge coming from your vagina.  You have redness, swelling, or pain around your incision.  You have fluid or blood coming from your incision.  Your incision feels warm to the touch.  You have pus or a bad smell coming from your incision.  You have a fever.  Your incision  starts to break open.  You have pain in the abdomen, and it gets worse or does not get better when you take medicine.  You develop a rash.  You develop nausea and vomiting.  You feel lightheaded. Get help right away if:  You develop pain in your chest or leg.  You become short of breath.  You faint.  You have increased bleeding from your vagina. Summary  After the procedure, it is common to have pain, bleeding in  the vagina, and symptoms of menopause.  Follow instructions from your health care provider about how to take care of your incision.  Follow instructions from your health care provider about activities and restrictions.  Check your incision every day for signs of infection and report any symptoms to your health care provider. This information is not intended to replace advice given to you by your health care provider. Make sure you discuss any questions you have with your health care provider. Document Released: 11/07/2005 Document Revised: 12/12/2016 Document Reviewed: 12/12/2016 Elsevier Interactive Patient Education  2018 [place]. Bilateral Salpingo-Oophorectomy Bilateral salpingo-oophorectomy is the surgical removal of both fallopian tubes and both ovaries. The ovaries are reproductive organs that produce eggs in women. The fallopian tubes allow eggs to move from the ovaries to the uterus. You may need this procedure if you:  Have had your uterus removed. This procedure is usually done after the uterus is removed.  Have cancer of the fallopian tubes or ovaries.  Have a high risk of cancer of the fallopian tubes or ovaries.  There are three different techniques that can be used for this procedure:  Open. One large incision will be made in your abdomen.  Laparoscopic. A thin, lighted tube with a small camera on the end (laparoscope) will be used to help perform the procedure. The laparoscope will allow your surgeon to make several small incisions in the abdomen instead of one large incision.  Robot-assisted. A computer will be used to control surgical instruments that are attached to robotic arms. A laparoscope may also be used with this technique.  As a result of this procedure, you will become sterile (unable to become pregnant), and you will go into menopause (no longer able to have menstrual periods). You may develop symptoms of menopause such as hot flashes, night sweats, and  mood changes. Your sex drive may also be affected. Tell a health care provider about:  Any allergies you have.  All medicines you are taking, including vitamins, herbs, eye drops, creams, and over-the-counter medicines.  Any problems you or family members have had with anesthetic medicines.  Any blood disorders you have.  Any surgeries you have had.  Any medical conditions you have.  Whether you are pregnant or may be pregnant. What are the risks? Generally, this is a safe procedure. However, problems may occur, including:  Infection.  Bleeding.  Allergic reactions to medicines.  Damage to other structures or organs.  Blood clots in the legs or lungs.  What happens before the procedure? Staying hydrated Follow instructions from your health care provider about hydration, which may include:  Up to 2 hours before the procedure - you may continue to drink clear liquids, such as water, clear fruit juice, black coffee, and plain tea.  Eating and drinking restrictions Follow instructions from your health care provider about eating and drinking, which may include:  8 hours before the procedure - stop eating heavy meals or foods such as meat, fried foods, or fatty foods.  6 hours before the procedure - stop eating light meals or foods, such as toast or cereal.  6 hours before the procedure - stop drinking milk or drinks that contain milk.  2 hours before the procedure - stop drinking clear liquids.  Medicines  Ask your health care provider about: ? Changing or stopping your regular medicines. This is especially important if you are taking diabetes medicines or blood thinners. ? Taking medicines such as aspirin and ibuprofen. These medicines can thin your blood. Do not take these medicines before your procedure if your health care provider instructs you not to.  You may be given antibiotic medicine to help prevent infection. General instructions  Do not smoke for at least  2 weeks before your procedure or as told by your health care provider.  You may have an exam or testing.  You may have a blood or urine sample taken.  Ask your health care provider how your surgical site will be marked or identified.  Plan to have someone take you home from the hospital.  If you will be going home right after the procedure, plan to have someone with you for 24 hours. What happens during the procedure?  To reduce your risk of infection: ? Your health care team will wash or sanitize their hands. ? Your skin will be washed with soap. ? Hair may be removed from the surgical area.  An IV tube will be inserted into one of your veins.  You will be given one or more of the following: ? A medicine to help you relax (sedative). ? A medicine to make you fall asleep (general anesthetic).  A thin tube (catheter) will be inserted through your urethra and into your bladder. The catheter drains urine during your procedure.  Depending on the type of surgery you are having, your surgeon will do one of the following: ? Make one incision in your abdomen (open surgery). ? Make two small incisions in your abdomen (laparoscopic surgery). The laparoscope will be passed through one incision, and surgical instruments will be passed through the other. ? Make several small incisions in your abdomen (robot-assisted surgery). A laparoscope and other surgical instruments may be passed through the incisions.  Your fallopian tubes and ovaries will be cut away from the uterus and removed.  Your blood vessels will be clamped and tied to prevent too much bleeding.  The incision(s) in your abdomen will be closed with stitches (sutures) or staples.  A bandage (dressing) may be placed over your incision(s). The procedure may vary among health care providers and hospitals. What happens after the procedure?  Your blood pressure, heart rate, breathing rate, and blood oxygen level will be monitored  until the medicines you were given have worn off.  You may continue to receive fluids and medicines through an IV tube.  You may continue to have a catheter draining your urine.  You may have to wear compression stockings. These stockings help to prevent blood clots and reduce swelling in your legs.  You will be given pain medicine as needed.  Do not drive for 24 hours if you received a sedative. Summary  Bilateral salpingo-oophorectomy is a procedure to remove both fallopian tubes and both ovaries.  There are three different techniques that can be used for this procedure, including open, laparoscopic, and robotic. Talk with your health care provider about how your procedure will be done.  As a result of this procedure, you will become sterile and you will go  into menopause.  Plan to have someone take you home from the hospital. This information is not intended to replace advice given to you by your health care provider. Make sure you discuss any questions you have with your health care provider. Document Released: 11/07/2005 Document Revised: 12/12/2016 Document Reviewed: 12/12/2016 Elsevier Interactive Patient Education  [employer].

## 2017-12-27 NOTE — Progress Notes (Signed)
Pre and post op teaching completed. Written copy provided in AVS. Surgery will be posted for 2/13 with Dr. Theora Gianotti. PAT will be arranged and she will be notified with this appointment. Oncology Nurse Navigator Documentation  Navigator Location: [facility] (12/27/17 1500)   )Navigator Encounter Type: Follow-up Appt (12/27/17 1500)                 Multidisiplinary Clinic Type: GYN (12/27/17 1500)   Patient Visit Type: GynOnc (12/27/17 1500)                              Time Spent with Patient: 30 (12/27/17 1500)

## 2017-12-28 ENCOUNTER — Telehealth: Payer: Self-pay

## 2017-12-28 NOTE — Telephone Encounter (Signed)
Notified Sandra Carrillo of appointment with Dr. Georgianne Fick at [facility], 2/12 at 1200p. Read back performed. Oncology Nurse Navigator Documentation  Navigator Location: [facility] (12/28/17 1400)   )Navigator Encounter Type: Telephone (12/28/17 1400) Telephone: Appt Confirmation/Clarification (12/28/17 1400)                                                  Time Spent with Patient: 15 (12/28/17 1400)

## 2017-12-28 NOTE — Progress Notes (Signed)
Surgery booking request faxed to OR scheduling with confirmation of receipt. Copy of consent faxed to PAT with confirmation of receipt. Original consent sent to medical records for filing.

## 2017-12-28 NOTE — Telephone Encounter (Signed)
Voicemail left with Sandra Carrillo regarding PAT appt, tomorrow, 2/8, at 1430. Phone interview.  Oncology Nurse Navigator Documentation  Navigator Location: [facility] (12/28/17 1600)   )Navigator Encounter Type: Telephone (12/28/17 1600) Telephone: Appt Confirmation/Clarification;Outgoing Call (12/28/17 1600)                                                  Time Spent with Patient: 15 (12/28/17 1600)

## 2017-12-29 ENCOUNTER — Encounter
Admission: RE | Admit: 2017-12-29 | Discharge: 2017-12-29 | Disposition: A | Discharge status: Home / Self Care | Payer: Commercial Managed Care - HMO | Source: Ambulatory Visit | Attending: Obstetrics and Gynecology | Admitting: Obstetrics and Gynecology

## 2017-12-29 ENCOUNTER — Other Ambulatory Visit: Payer: Self-pay

## 2017-12-29 DIAGNOSIS — Z01812 Encounter for preprocedural laboratory examination: Secondary | ICD-10-CM | POA: Insufficient documentation

## 2017-12-29 DIAGNOSIS — Z01810 Encounter for preprocedural cardiovascular examination: Secondary | ICD-10-CM | POA: Diagnosis not present

## 2017-12-29 DIAGNOSIS — I10 Essential (primary) hypertension: Secondary | ICD-10-CM | POA: Diagnosis present

## 2017-12-29 HISTORY — DX: Depression, unspecified: F32.A

## 2017-12-29 HISTORY — DX: Major depressive disorder, single episode, unspecified: F32.9

## 2017-12-29 HISTORY — DX: Personal history of urinary calculi: Z87.442

## 2017-12-29 NOTE — Pre-Procedure Instructions (Signed)
Attempted to reach patient via phone x 2 without success.

## 2017-12-29 NOTE — Patient Instructions (Addendum)
Your procedure is scheduled [id number], February 13th  To find out your arrival time, please call [phone]      Between 1PM and 3PM on Tuesday, February 12th   Report to [facility].   Remember: Instructions that are not followed completely may result in serious   medical risk, up to and including death, or upon the discretion of your       surgeon and anesthesiologist your surgery may need to be rescheduled.     _X__ 1. Do not eat food after midnight the night before your procedure.                 No GUM, lozengers or hard candies.                   You may drink clear liquids up to 2 hours before you are                    scheduled to arrive for your surgery-                  Clear Liquids include:  water, apple juice without pulp, clear carbohydrate                 drink such as Clearfast of Gartorade, Black Coffee or Tea (Do not add                 anything to coffee or tea).  __X__2.  On the morning of surgery brush your teeth with toothpaste and water,                     you may rinse your mouth with mouthwash if you wish.                          Do not swallow any toothpaste or mouthwash.     _X__ 3.  No Alcohol for 24 hours before or after surgery.   _X__ 4.  Do Not Smoke or use e-cigarettes For 24 Hours Prior to Your Surgery.                 Do not use any chewable tobacco products for at least 6 hours prior to                 surgery.  ____  5.  Bring all medications with you on the day of surgery if instructed.   ____  6.  Notify your doctor if there is any change in your medical condition      (cold, fever, infections).     Do not wear jewelry, make-up, hairpins, clips or nail polish. Do not wear lotions, powders, or perfumes. You may wear deodorant. Do not shave 48 hours prior to surgery. Men may shave face and neck. Do not bring valuables to the hospital.    [facility] is not responsible for any  belongings or valuables.  Contacts, dentures or bridgework may not be worn into surgery. Leave your suitcase in the car. After surgery it may be brought to your room. For patients admitted to the hospital, discharge time is determined by your treatment team.   Patients discharged the day of surgery will not be allowed to drive home.   Please read over the following fact sheets that you were given:   [place] PHONE  PREPARING FOR SURGERY PAPERWORK   ____ Take these medicines the morning of surgery with A SIP OF WATER:    1.  AMLODIPINE  2.  INDERAL  3.  DEMEDEX  4.  NEURONTIN   5.  TRAMADOL, IF NEEDED  6.  BRING ADVAIR INHALER WITH YOU ON SURGERY DAY  ____ Fleet Enema (as directed)   __X__ Use CHG Soap as directed (INSTRUCTIONS PROVIDED)  __X__ Use inhalers on the day of surgery.  [place]  __X__   Stop ALL ASPIRIN PRODUCTS NOW!!              THIS INCLUDES EXCEDRIN / GOODYS POWDERS  __X__ Stop Anti-inflammatories NOW!!               THIS INCLUDES IBUPROFEN / MOTRIN / ADVIL / ALEVE                 AND NAPROSYN                              YOU MAY TAKE TYLENOL  ____ Stop supplements until after surgery.    ____ Bring C-Pap to the hospital.   WEAR LOOSE FITTING CLOTHING TO [place]        FOR COMFORT UPON DISCHARGE.  KEEP STOOL SOFTENERS ON HAND FOR USE       AFTER SURGERY.

## 2018-01-01 ENCOUNTER — Encounter
Admission: RE | Admit: 2018-01-01 | Discharge: 2018-01-01 | Disposition: A | Discharge status: Home / Self Care | Payer: Commercial Managed Care - HMO | Source: Ambulatory Visit | Attending: Obstetrics and Gynecology | Admitting: Obstetrics and Gynecology

## 2018-01-01 DIAGNOSIS — Z01810 Encounter for preprocedural cardiovascular examination: Secondary | ICD-10-CM | POA: Diagnosis not present

## 2018-01-01 LAB — COMPREHENSIVE METABOLIC PANEL
ALBUMIN: 4 g/dL (ref 3.5–5.0)
ALK PHOS: 103 U/L (ref 38–126)
ALT: 10 U/L — ABNORMAL LOW (ref 14–54)
AST: 17 U/L (ref 15–41)
Anion gap: 9 (ref 5–15)
BILIRUBIN TOTAL: 0.9 mg/dL (ref 0.3–1.2)
BUN: 8 mg/dL (ref 6–20)
CALCIUM: 9.5 mg/dL (ref 8.9–10.3)
CO2: 28 mmol/L (ref 22–32)
CREATININE: 0.99 mg/dL (ref 0.44–1.00)
Chloride: 101 mmol/L (ref 101–111)
GFR calc non Af Amer: >60 mL/min (ref >60)
GLUCOSE: 94 mg/dL (ref 65–99)
Potassium: 3.3 mmol/L — ABNORMAL LOW (ref 3.5–5.1)
Sodium: 138 mmol/L (ref 135–145)
TOTAL PROTEIN: 7.2 g/dL (ref 6.5–8.1)

## 2018-01-01 LAB — TYPE AND SCREEN
ABO/RH(D): B NEG
Antibody Screen: NEGATIVE

## 2018-01-01 LAB — URINALYSIS, ROUTINE W REFLEX MICROSCOPIC
BILIRUBIN URINE: NEGATIVE
GLUCOSE, UA: NEGATIVE mg/dL
KETONES UR: NEGATIVE mg/dL
Nitrite: NEGATIVE
PH: 6 (ref 5.0–8.0)
Protein, ur: 30 mg/dL — AB
Specific Gravity, Urine: 1.013 (ref 1.005–1.030)

## 2018-01-01 LAB — PROTIME-INR
INR: 0.92
Prothrombin Time: 12.3 seconds (ref 11.4–15.2)

## 2018-01-01 LAB — CBC WITH DIFFERENTIAL/PLATELET
Basophils Absolute: 0 10*3/uL (ref 0–0.1)
Basophils Relative: 1 %
Eosinophils Absolute: 0.1 10*3/uL (ref 0–0.7)
Eosinophils Relative: 2 %
HEMATOCRIT: 42.6 % (ref 35.0–47.0)
HEMOGLOBIN: 13.7 g/dL (ref 12.0–16.0)
LYMPHS ABS: 1.5 10*3/uL (ref 1.0–3.6)
LYMPHS PCT: 27 %
MCH: 28.9 pg (ref 26.0–34.0)
MCHC: 32.2 g/dL (ref 32.0–36.0)
MCV: 89.9 fL (ref 80.0–100.0)
Monocytes Absolute: 0.5 10*3/uL (ref 0.2–0.9)
Monocytes Relative: 9 %
NEUTROS ABS: 3.4 10*3/uL (ref 1.4–6.5)
Neutrophils Relative %: 61 %
Platelets: 268 10*3/uL (ref 150–440)
RBC: 4.74 MIL/uL (ref 3.80–5.20)
RDW: 13.9 % (ref 11.5–14.5)
WBC: 5.5 10*3/uL (ref 3.6–11.0)

## 2018-01-01 LAB — APTT: aPTT: 32 seconds (ref 24–36)

## 2018-01-02 ENCOUNTER — Ambulatory Visit (INDEPENDENT_AMBULATORY_CARE_PROVIDER_SITE_OTHER): Payer: 59 | Admitting: Obstetrics and Gynecology

## 2018-01-02 DIAGNOSIS — Z01818 Encounter for other preprocedural examination: Secondary | ICD-10-CM

## 2018-01-02 NOTE — Progress Notes (Signed)
Briefly met with patient today to introduce myself since I will be assisting in her surgery and to verify she has no further questions.  No charge visit   Malachy Mood, MD, [facility], [facility] 01/02/2018, 3:08 PM

## 2018-01-03 ENCOUNTER — Encounter
Admission: RE | Disposition: A | Discharge status: Home / Self Care | Payer: Self-pay | Source: Ambulatory Visit | Attending: Obstetrics and Gynecology

## 2018-01-03 ENCOUNTER — Ambulatory Visit: Payer: Commercial Managed Care - HMO | Admitting: Anesthesiology

## 2018-01-03 ENCOUNTER — Ambulatory Visit
Admission: RE | Admit: 2018-01-03 | Discharge: 2018-01-03 | Disposition: A | Discharge status: Home / Self Care | Payer: Commercial Managed Care - HMO | Source: Ambulatory Visit | Attending: Obstetrics and Gynecology | Admitting: Obstetrics and Gynecology

## 2018-01-03 ENCOUNTER — Other Ambulatory Visit: Payer: Self-pay

## 2018-01-03 DIAGNOSIS — N736 Female pelvic peritoneal adhesions (postinfective): Secondary | ICD-10-CM | POA: Diagnosis not present

## 2018-01-03 DIAGNOSIS — Z87440 Personal history of urinary (tract) infections: Secondary | ICD-10-CM | POA: Insufficient documentation

## 2018-01-03 DIAGNOSIS — D271 Benign neoplasm of left ovary: Secondary | ICD-10-CM

## 2018-01-03 DIAGNOSIS — N200 Calculus of kidney: Secondary | ICD-10-CM | POA: Insufficient documentation

## 2018-01-03 DIAGNOSIS — Z882 Allergy status to sulfonamides status: Secondary | ICD-10-CM | POA: Insufficient documentation

## 2018-01-03 DIAGNOSIS — Z888 Allergy status to other drugs, medicaments and biological substances status: Secondary | ICD-10-CM | POA: Insufficient documentation

## 2018-01-03 DIAGNOSIS — Z90722 Acquired absence of ovaries, bilateral: Secondary | ICD-10-CM

## 2018-01-03 DIAGNOSIS — Z90710 Acquired absence of both cervix and uterus: Secondary | ICD-10-CM | POA: Insufficient documentation

## 2018-01-03 DIAGNOSIS — Z9889 Other specified postprocedural states: Secondary | ICD-10-CM | POA: Insufficient documentation

## 2018-01-03 DIAGNOSIS — Z7982 Long term (current) use of aspirin: Secondary | ICD-10-CM | POA: Insufficient documentation

## 2018-01-03 DIAGNOSIS — N189 Chronic kidney disease, unspecified: Secondary | ICD-10-CM | POA: Diagnosis not present

## 2018-01-03 DIAGNOSIS — Z79899 Other long term (current) drug therapy: Secondary | ICD-10-CM | POA: Insufficient documentation

## 2018-01-03 DIAGNOSIS — J45909 Unspecified asthma, uncomplicated: Secondary | ICD-10-CM | POA: Insufficient documentation

## 2018-01-03 DIAGNOSIS — N7011 Chronic salpingitis: Secondary | ICD-10-CM | POA: Insufficient documentation

## 2018-01-03 DIAGNOSIS — Z87442 Personal history of urinary calculi: Secondary | ICD-10-CM | POA: Insufficient documentation

## 2018-01-03 DIAGNOSIS — R1900 Intra-abdominal and pelvic swelling, mass and lump, unspecified site: Secondary | ICD-10-CM | POA: Diagnosis not present

## 2018-01-03 DIAGNOSIS — M5136 Other intervertebral disc degeneration, lumbar region: Secondary | ICD-10-CM | POA: Diagnosis not present

## 2018-01-03 DIAGNOSIS — K5900 Constipation, unspecified: Secondary | ICD-10-CM | POA: Diagnosis not present

## 2018-01-03 DIAGNOSIS — I129 Hypertensive chronic kidney disease with stage 1 through stage 4 chronic kidney disease, or unspecified chronic kidney disease: Secondary | ICD-10-CM | POA: Diagnosis not present

## 2018-01-03 DIAGNOSIS — F17210 Nicotine dependence, cigarettes, uncomplicated: Secondary | ICD-10-CM | POA: Diagnosis not present

## 2018-01-03 DIAGNOSIS — Z7951 Long term (current) use of inhaled steroids: Secondary | ICD-10-CM | POA: Diagnosis not present

## 2018-01-03 HISTORY — PX: ROBOTIC ASSISTED BILATERAL SALPINGO OOPHERECTOMY: SHX6078

## 2018-01-03 LAB — ABO/RH: ABO/RH(D): B NEG

## 2018-01-03 LAB — I-STAT 4 (NA, K, GLUC, HGB, HCT): Potassium: 3.4

## 2018-01-03 SURGERY — ROBOTIC ASSISTED BILATERAL SALPINGO OOPHORECTOMY
Anesthesia: General | Laterality: Bilateral

## 2018-01-03 MED ORDER — CHLORHEXIDINE GLUCONATE CLOTH 2 % EX PADS: 6.0000 | MEDICATED_PAD | Freq: Once | CUTANEOUS | Status: DC

## 2018-01-03 MED ORDER — SEVOFLURANE IN SOLN
RESPIRATORY_TRACT | Status: AC
  Filled 2018-01-03: qty 250

## 2018-01-03 MED ORDER — FENTANYL CITRATE (PF) 100 MCG/2ML IJ SOLN
INTRAMUSCULAR | Status: AC
  Filled 2018-01-03: qty 2

## 2018-01-03 MED ORDER — ACETAMINOPHEN 160 MG/5ML PO SOLN
325.0000 mg | ORAL | Status: DC | PRN
  Filled 2018-01-03: qty 20.3

## 2018-01-03 MED ORDER — DEXAMETHASONE SODIUM PHOSPHATE 10 MG/ML IJ SOLN
INTRAMUSCULAR | Status: DC | PRN
  Administered 2018-01-03: 10 mg via INTRAVENOUS

## 2018-01-03 MED ORDER — MIDAZOLAM HCL 2 MG/2ML IJ SOLN
INTRAMUSCULAR | Status: AC
  Filled 2018-01-03: qty 2

## 2018-01-03 MED ORDER — BUPIVACAINE HCL 0.5 % IJ SOLN
INTRAMUSCULAR | Status: DC | PRN
  Administered 2018-01-03: 27 mL

## 2018-01-03 MED ORDER — ROCURONIUM BROMIDE 100 MG/10ML IV SOLN
INTRAVENOUS | Status: DC | PRN
  Administered 2018-01-03: 50 mg via INTRAVENOUS
  Administered 2018-01-03: 30 mg via INTRAVENOUS

## 2018-01-03 MED ORDER — MEPERIDINE HCL 50 MG/ML IJ SOLN: 6.2500 mg | INTRAMUSCULAR | Status: DC | PRN

## 2018-01-03 MED ORDER — PROMETHAZINE HCL 25 MG/ML IJ SOLN: 6.2500 mg | INTRAMUSCULAR | Status: DC | PRN

## 2018-01-03 MED ORDER — FAMOTIDINE 20 MG PO TABS
ORAL_TABLET | ORAL | Status: AC
  Filled 2018-01-03: qty 1

## 2018-01-03 MED ORDER — HYDROMORPHONE HCL 1 MG/ML IJ SOLN
0.2500 mg | INTRAMUSCULAR | Status: DC | PRN
  Administered 2018-01-03: 0.25 mg via INTRAVENOUS
  Administered 2018-01-03 (×3): 0.5 mg via INTRAVENOUS
  Administered 2018-01-03: 0.25 mg via INTRAVENOUS

## 2018-01-03 MED ORDER — ACETAMINOPHEN 10 MG/ML IV SOLN
INTRAVENOUS | Status: AC
  Filled 2018-01-03: qty 100

## 2018-01-03 MED ORDER — ACETAMINOPHEN 10 MG/ML IV SOLN
1000.0000 mg | Freq: Once | INTRAVENOUS | Status: DC | PRN
  Administered 2018-01-03: 1000 mg via INTRAVENOUS

## 2018-01-03 MED ORDER — CEFAZOLIN SODIUM-DEXTROSE 2-3 GM-%(50ML) IV SOLR
INTRAVENOUS | Status: DC | PRN
  Administered 2018-01-03: 2 g via INTRAVENOUS

## 2018-01-03 MED ORDER — SUGAMMADEX SODIUM 500 MG/5ML IV SOLN
INTRAVENOUS | Status: DC | PRN
  Administered 2018-01-03: 200 mg via INTRAVENOUS

## 2018-01-03 MED ORDER — PROPOFOL 10 MG/ML IV BOLUS
INTRAVENOUS | Status: DC | PRN
  Administered 2018-01-03: 140 mg via INTRAVENOUS

## 2018-01-03 MED ORDER — BUPIVACAINE HCL (PF) 0.5 % IJ SOLN
INTRAMUSCULAR | Status: AC
  Filled 2018-01-03: qty 30

## 2018-01-03 MED ORDER — EPHEDRINE SULFATE 50 MG/ML IJ SOLN
INTRAMUSCULAR | Status: DC | PRN
  Administered 2018-01-03: 10 mg via INTRAVENOUS

## 2018-01-03 MED ORDER — FENTANYL CITRATE (PF) 100 MCG/2ML IJ SOLN
INTRAMUSCULAR | Status: DC | PRN
  Administered 2018-01-03 (×2): 25 ug via INTRAVENOUS
  Administered 2018-01-03: 50 ug via INTRAVENOUS
  Administered 2018-01-03: 100 ug via INTRAVENOUS

## 2018-01-03 MED ORDER — LIDOCAINE HCL (CARDIAC) 20 MG/ML IV SOLN
INTRAVENOUS | Status: DC | PRN
  Administered 2018-01-03: 50 mg via INTRAVENOUS

## 2018-01-03 MED ORDER — IBUPROFEN 600 MG PO TABS: 600.0000 mg | ORAL_TABLET | Freq: Four times a day (QID) | ORAL | 3 refills | Status: DC | PRN

## 2018-01-03 MED ORDER — HYDROMORPHONE HCL 1 MG/ML IJ SOLN
INTRAMUSCULAR | Status: AC
  Administered 2018-01-03: 0.25 mg via INTRAVENOUS
  Filled 2018-01-03: qty 1

## 2018-01-03 MED ORDER — OXYCODONE-ACETAMINOPHEN 5-325 MG PO TABS: 1.0000 | ORAL_TABLET | Freq: Four times a day (QID) | ORAL | 0 refills | Status: DC | PRN

## 2018-01-03 MED ORDER — ROCURONIUM BROMIDE 50 MG/5ML IV SOLN
INTRAVENOUS | Status: AC
  Filled 2018-01-03: qty 1

## 2018-01-03 MED ORDER — MIDAZOLAM HCL 2 MG/2ML IJ SOLN
INTRAMUSCULAR | Status: DC | PRN
  Administered 2018-01-03: 2 mg via INTRAVENOUS

## 2018-01-03 MED ORDER — FAMOTIDINE 20 MG PO TABS
20.0000 mg | ORAL_TABLET | Freq: Once | ORAL | Status: AC
  Administered 2018-01-03: 20 mg via ORAL

## 2018-01-03 MED ORDER — SODIUM CHLORIDE FLUSH 0.9 % IV SOLN
INTRAVENOUS | Status: AC
  Filled 2018-01-03: qty 10

## 2018-01-03 MED ORDER — ACETAMINOPHEN 325 MG PO TABS: 325.0000 mg | ORAL_TABLET | ORAL | Status: DC | PRN

## 2018-01-03 MED ORDER — OXYCODONE HCL 5 MG PO TABS
5.0000 mg | ORAL_TABLET | Freq: Once | ORAL | Status: AC
  Administered 2018-01-03: 5 mg via ORAL

## 2018-01-03 MED ORDER — LACTATED RINGERS IV SOLN
INTRAVENOUS | Status: DC
  Administered 2018-01-03 (×2): via INTRAVENOUS

## 2018-01-03 MED ORDER — OXYCODONE HCL 5 MG PO TABS
ORAL_TABLET | ORAL | Status: AC
  Administered 2018-01-03: 5 mg via ORAL
  Filled 2018-01-03: qty 1

## 2018-01-03 MED ORDER — ONDANSETRON HCL 4 MG/2ML IJ SOLN
INTRAMUSCULAR | Status: DC | PRN
  Administered 2018-01-03: 4 mg via INTRAVENOUS

## 2018-01-03 MED ORDER — HYDROCODONE-ACETAMINOPHEN 7.5-325 MG PO TABS
1.0000 | ORAL_TABLET | Freq: Once | ORAL | Status: DC | PRN
Start: 2018-01-03 — End: 2018-01-03
  Filled 2018-01-03: qty 1

## 2018-01-03 MED ORDER — HYDROMORPHONE HCL 1 MG/ML IJ SOLN
INTRAMUSCULAR | Status: AC
  Administered 2018-01-03: 0.5 mg via INTRAVENOUS
  Filled 2018-01-03: qty 1

## 2018-01-03 MED ORDER — PROPOFOL 10 MG/ML IV BOLUS
INTRAVENOUS | Status: AC
  Filled 2018-01-03: qty 20

## 2018-01-03 SURGICAL SUPPLY — 71 items
BAG URINE DRAINAGE (UROLOGICAL SUPPLIES) ×2 IMPLANT
BLADE SURG 11 STRL SS SAFETY (MISCELLANEOUS) ×2 IMPLANT
CANISTER SUCT 1200ML W/VALVE (MISCELLANEOUS) ×2 IMPLANT
CANNULA DILATOR 10 W/SLV (CANNULA) ×4 IMPLANT
CANNULA DILATOR 5 W/SLV (CANNULA) ×2 IMPLANT
CATH FOLEY 2WAY  5CC 16FR (CATHETERS) ×1
CATH URTH 16FR FL 2W BLN LF (CATHETERS) ×1 IMPLANT
CHLORAPREP W/TINT 26ML (MISCELLANEOUS) ×2 IMPLANT
CORD BIP STRL DISP 12FT (MISCELLANEOUS) ×2 IMPLANT
CORD MONOPOLAR M/FML 12FT (MISCELLANEOUS) ×2 IMPLANT
COVER TIP SHEARS 8 DVNC (MISCELLANEOUS) ×1 IMPLANT
COVER TIP SHEARS 8MM DA VINCI (MISCELLANEOUS) ×1
DEFOGGER SCOPE WARMER CLEARIFY (MISCELLANEOUS) ×2 IMPLANT
DERMABOND ADVANCED (GAUZE/BANDAGES/DRESSINGS) ×1
DERMABOND ADVANCED .7 DNX12 (GAUZE/BANDAGES/DRESSINGS) ×1 IMPLANT
DRAPE LEGGINS SURG 28X43 STRL (DRAPES) ×2 IMPLANT
DRAPE SHEET LG 3/4 BI-LAMINATE (DRAPES) ×4 IMPLANT
DRESSING SURGICEL FIBRLLR 1X2 (HEMOSTASIS) IMPLANT
DRSG SURGICEL FIBRILLAR 1X2 (HEMOSTASIS)
ELECT REM PT RETURN 9FT ADLT (ELECTROSURGICAL) ×2
ELECTRODE REM PT RTRN 9FT ADLT (ELECTROSURGICAL) ×1 IMPLANT
FILTER LAP SMOKE EVAC STRL (MISCELLANEOUS) ×2 IMPLANT
GLOVE BIO SURGEON STRL SZ 6.5 (GLOVE) ×10 IMPLANT
GLOVE INDICATOR 7.0 STRL GRN (GLOVE) ×10 IMPLANT
GOWN STRL REUS W/ TWL LRG LVL3 (GOWN DISPOSABLE) ×4 IMPLANT
GOWN STRL REUS W/TWL LRG LVL3 (GOWN DISPOSABLE) ×4
GRASPER SUT TROCAR 14GX15 (MISCELLANEOUS) ×2 IMPLANT
IRRIGATION STRYKERFLOW (MISCELLANEOUS) ×1 IMPLANT
IRRIGATOR STRYKERFLOW (MISCELLANEOUS) ×2
KIT ACCESSORY DA VINCI DISP (KITS) ×1
KIT ACCESSORY DVNC DISP (KITS) ×1 IMPLANT
KIT PINK PAD W/HEAD ARE REST (MISCELLANEOUS) ×2
KIT PINK PAD W/HEAD ARM REST (MISCELLANEOUS) ×1 IMPLANT
KIT TURNOVER CYSTO (KITS) ×2 IMPLANT
LABEL OR SOLS (LABEL) ×2 IMPLANT
MANIPULATOR VCARE LG CRV RETR (MISCELLANEOUS) IMPLANT
MANIPULATOR VCARE STD CRV RETR (MISCELLANEOUS) IMPLANT
NDL INSUFF 14G 150MM VS150000 (NEEDLE) ×2 IMPLANT
NDL INSUFF ACCESS 14 VERSASTEP (NEEDLE) ×2 IMPLANT
NEEDLE HYPO 22GX1.5 SAFETY (NEEDLE) ×4 IMPLANT
NS IRRIG 1000ML POUR BTL (IV SOLUTION) ×2 IMPLANT
NS IRRIG 500ML POUR BTL (IV SOLUTION) ×2 IMPLANT
OCCLUDER COLPOPNEUMO (BALLOONS) ×2 IMPLANT
PACK GYN LAPAROSCOPIC (MISCELLANEOUS) ×2 IMPLANT
PAD OB MATERNITY 4.3X12.25 (PERSONAL CARE ITEMS) ×2 IMPLANT
PAD PREP 24X41 OB/GYN DISP (PERSONAL CARE ITEMS) ×2 IMPLANT
PENCIL ELECTRO HAND CTR (MISCELLANEOUS) ×2 IMPLANT
POUCH ENDO CATCH II 15MM (MISCELLANEOUS) ×2 IMPLANT
POUCH SPECIMEN RETRIEVAL 10MM (ENDOMECHANICALS) ×2 IMPLANT
RETRACTOR GRASP SM DA VINCI (INSTRUMENTS) ×1
RETRACTOR GRASP SM DVNC (INSTRUMENTS) ×1 IMPLANT
SCISSORS METZENBAUM CVD 33 (INSTRUMENTS) IMPLANT
SLEEVE VERSASTEP EXPAND ONEST (MISCELLANEOUS) ×10 IMPLANT
SOLUTION ELECTROLUBE (MISCELLANEOUS) ×2 IMPLANT
SUT DVC VLOC 180 0 12IN GS21 (SUTURE) ×4
SUT VIC AB 1 CT1 36 (SUTURE) ×4 IMPLANT
SUT VIC AB 2-0 CT1 27 (SUTURE) ×1
SUT VIC AB 2-0 CT1 TAPERPNT 27 (SUTURE) ×1 IMPLANT
SUT VIC AB 4-0 FS2 27 (SUTURE) ×2 IMPLANT
SUT VICRYL 0 AB UR-6 (SUTURE) ×2 IMPLANT
SUTURE DVC VLC 180 0 12IN GS21 (SUTURE) ×2 IMPLANT
SYR 10ML LL (SYRINGE) ×4 IMPLANT
SYR 3ML LL SCALE MARK (SYRINGE) ×2 IMPLANT
SYR 50ML LL SCALE MARK (SYRINGE) ×2 IMPLANT
SYR CONTROL 10ML (SYRINGE) ×2 IMPLANT
TROCAR BALLN GELPORT 12X130M (ENDOMECHANICALS) ×2 IMPLANT
TROCAR DISP BLADELESS 8 DVNC (TROCAR) ×1 IMPLANT
TROCAR DISP BLADELESS 8MM (TROCAR) ×1
TROCAR VERSASTEP 12M LG PL (TROCAR) ×2 IMPLANT
TROCAR VERSASTEP PLUS 12MM (TROCAR) ×4 IMPLANT
TUBING INSUF HEATED (TUBING) ×2 IMPLANT

## 2018-01-03 NOTE — Anesthesia Postprocedure Evaluation (Signed)
Anesthesia Post Note  Patient: Sandra Carrillo  Procedure(s) Performed: ROBOTIC ASSISTED BILATERAL SALPINGO OOPHORECTOMY (Bilateral )  Patient location during evaluation: PACU Anesthesia Type: General Level of consciousness: awake and alert Pain management: pain level controlled Vital Signs Assessment: post-procedure vital signs reviewed and stable Respiratory status: spontaneous breathing, nonlabored ventilation, respiratory function stable and patient connected to nasal cannula oxygen Cardiovascular status: blood pressure returned to baseline and stable Postop Assessment: no apparent nausea or vomiting Anesthetic complications: no     Last Vitals:  Vitals:   01/03/18 1126 01/03/18 1139  BP: (!) 141/87 (!) 151/78  Pulse: 80 72  Resp: 12 14  Temp: 36.5 C (!) 36.2 C  SpO2: 95% 96%    Last Pain:  Vitals:   01/03/18 1139  TempSrc: Temporal  PainSc: 5                  Baby Gieger Harvie Heck

## 2018-01-03 NOTE — Discharge Instructions (Addendum)
AMBULATORY SURGERY  DISCHARGE INSTRUCTIONS   1) The drugs that you were given will stay in your system until tomorrow so for the next 24 hours you should not:  A) Drive an automobile B) Make any legal decisions C) Drink any alcoholic beverage   2) You may resume regular meals tomorrow.  Today it is better to start with liquids and gradually work up to solid foods.  You may eat anything you prefer, but it is better to start with liquids, then soup and crackers, and gradually work up to solid foods.   3) Please notify your doctor immediately if you have any unusual bleeding, trouble breathing, redness and pain at the surgery site, drainage, fever, or pain not relieved by medication.    4) Additional Instructions:Do not take tramadol while you are taking the oxycodone for pain.        Please contact your physician with any problems or Same Day Surgery at [phone], Monday through Friday 6 am to 4 pm, or [place] at [facility] number at [phone].

## 2018-01-03 NOTE — Op Note (Signed)
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   SURGEON: Surgeon(s) and Role: Panel 1:   Malachy Mood, MD  ASSISTANT:     Angeles Gaetana Michaelis, MD -   ANESTHESIA: Choice   PROCEDURE: Procedure(s): Robotic BSO with extraction of left ovarian mass via contained manual morcellation.   ESTIMATED BLOOD LOSS: Minimal  DRAINS: Foley  TOTAL IV FLUIDS: as per anesthesia  SPECIMENS:  Bilateral tubes and ovaries, washings.  COMPLICATIONS: None  DISPOSITION: Stable to PACU  CONDITION: Stable  INDICATIONS: Pelvic mass  FINDINGS: Exam under anesthesia revealed firm adnexal masses at the vaginal cuff. The parametria was smooth. The uterus and cervix are surgically absent. Intraoperative findings:. The right adnexa was normal, large 6-8 cm right ovarian mass c/w fibroma and left hydrosalpinx. The upper abdomen was normal including omentum, bowel, liver, stomach, and diaphragmatic surfaces. There was no evidence of grossly malignant pelvic or right para-aortic lymph nodes. There were adhesions involving the epiploica to the vaginal cuff and the omentum to the left side wall.   PROCEDURE IN DETAIL: After informed consent was obtained, the patient was taken to the operating room where anesthesia was obtained without difficulty. The patient was positioned in the dorsal lithotomy position in [place] and her arms were carefully tucked at her sides and the usual precautions were taken.  She was prepped and draped in normal sterile fashion.  Time-out was performed and a Foley catheter was placed into the bladder.    A supraumbilical port was placed under direct visualization. The upper abdomen and pelvis were evaluated with the above noted findings. The LUQ assistant 5 mm port and the bilateral 8 mm  robotic ports were placed under direct visualization. After placement the patient was placed in Trendelenburg and the bowel was displaced up into the upper abdomen.  Cytologic washings were obtained. The robot was docked. Round ligaments were divided on each side with the EndoShears and the retroperitoneal space was opened bilaterally.  The ureters were identified and preserved.  The infundibulopelvic ligaments were skeletonized, sealed and divided with the LigaSure device.  The remaining attachments of the ovaries were lysed and the specimens were freed.   A suprapubic 5/12 port was placed in the midline. The right adnexa was removed in an EndoCatch bag. Next a 15 mm Endcatch bag was placed through this incision and the left ovarian mass and hydrosalpinx placed in the bag. The bag was retracted to the skin surface and the hydrosalpinx was removed. The incision was extended to approximately 3-4 cm including the fascia. The ovarian specimen was grasped and contained manual morcellation was performed in the bag. The specimen and bag were removed. Intraoperative pathology negative for malignancy.   Hemostasis was observed. The intraperitoneal pressure was dropped, and all planes of dissection, vascular pedicles and the vaginal cuff were found to be hemostatic.  The suprapubic trocar was removed and the fascia was closed with 0 Vicryl suture in a running fashion. The lateral, and LUQ  trocars were removed under visualization.   Before the umbilical trocar was removed the CO2 gas was released.  The fascia there was closed with 0 Vicryl suture in a running fashion.   The skin incision at the umbilicus was closed with subcuticular stitch.  The remaining skin incisions were closed with 4-0 vicryl and Indermil glue.  The patient tolerated the procedure well.  Sponge, lap and needle counts were correct x2.  The patient was taken to recovery room in excellent condition.  Antibiotics: Given 1st or 2nd generation  cephalosporin, prior to the start of the procedure and discontinued after procedure   VTE prophylaxis: was ordered perioperatively (SCD).  I assisted Dr. Georgianne Fick on the procedure.  Gillis Ends, MD

## 2018-01-03 NOTE — Anesthesia Post-op Follow-up Note (Signed)
Anesthesia QCDR form completed.        

## 2018-01-03 NOTE — Anesthesia Procedure Notes (Signed)
Procedure Name: Intubation Date/Time: 01/03/2018 7:45 AM Performed by: Lesle Reek, CRNA Pre-anesthesia Checklist: Timeout performed, Patient being monitored, Suction available, Patient identified and Emergency Drugs available Patient Re-evaluated:Patient Re-evaluated prior to induction Oxygen Delivery Method: Circle system utilized Preoxygenation: Pre-oxygenation with 100% oxygen Induction Type: IV induction Laryngoscope Size: Mac and 3 Grade View: Grade I Tube type: Oral Tube size: 7.0 mm Number of attempts: 1 Placement Confirmation: breath sounds checked- equal and bilateral,  CO2 detector,  positive ETCO2 and ETT inserted through vocal cords under direct vision Secured at: 22 cm Tube secured with: Tape

## 2018-01-03 NOTE — Transfer of Care (Signed)
Immediate Anesthesia Transfer of Care Note  Patient: Yettem  Procedure(s) Performed: ROBOTIC ASSISTED BILATERAL SALPINGO OOPHORECTOMY (Bilateral )  Patient Location: PACU  Anesthesia Type:General  Level of Consciousness: awake, alert  and oriented  Airway & Oxygen Therapy: Patient Spontanous Breathing and Patient connected to nasal cannula oxygen  Post-op Assessment: Report given to RN  Post vital signs: Reviewed and stable  Last Vitals:  Vitals:   01/03/18 0719 01/03/18 1040  BP:  (P) 136/78  Pulse:  (P) 86  Resp:  (!) (P) 33  Temp: 36.6 C (P) 36.6 C  SpO2:  (P) 98%    Last Pain:  Vitals:   01/03/18 0719  TempSrc: Temporal  PainSc:          Complications: No apparent anesthesia complications

## 2018-01-03 NOTE — Interval H&P Note (Signed)
History and Physical Interval Note:  01/03/2018 7:23 AM  Sandra Carrillo  has presented today for surgery, with the diagnosis of pelvic mass  The various methods of treatment have been discussed with the patient and family. After consideration of risks, benefits and other options for treatment, the patient has consented to  Procedure(s): ROBOTIC ASSISTED BILATERAL SALPINGO OOPHORECTOMY (Bilateral) as a surgical intervention .  The patient's history has been reviewed, patient examined, no change in status, stable for surgery.  I have reviewed the patient's chart and labs.  Questions were answered to the patient's satisfaction.     Lois Ostrom ALVAREZ

## 2018-01-03 NOTE — Anesthesia Preprocedure Evaluation (Addendum)
Anesthesia Evaluation  Patient identified by MRN, date of birth, ID band Patient awake    Reviewed: Allergy & Precautions, H&P , NPO status , reviewed documented beta blocker date and time   Airway Mallampati: II  TM Distance: <3 FB     Dental  (+) Teeth Intact   Pulmonary asthma , Current Smoker,    Pulmonary exam normal        Cardiovascular hypertension, Normal cardiovascular exam Rhythm:regular Rate:Normal     Neuro/Psych    GI/Hepatic   Endo/Other    Renal/GU Renal disease     Musculoskeletal   Abdominal   Peds  Hematology   Anesthesia Other Findings Hx Renal stones, repeat K=3.4  Reproductive/Obstetrics                             Anesthesia Physical Anesthesia Plan  ASA: II  Anesthesia Plan: General ETT and General   Post-op Pain Management:    Induction:   PONV Risk Score and Plan: 3 and Ondansetron, Midazolam and Dexamethasone  Airway Management Planned:   Additional Equipment:   Intra-op Plan:   Post-operative Plan:   Informed Consent: I have reviewed the patients History and Physical, chart, labs and discussed the procedure including the risks, benefits and alternatives for the proposed anesthesia with the patient or authorized representative who has indicated his/her understanding and acceptance.   Dental Advisory Given  Plan Discussed with: CRNA  Anesthesia Plan Comments:        Anesthesia Quick Evaluation

## 2018-01-03 NOTE — Op Note (Signed)
Preoperative Diagnosis: 1) 57 y.o. with solid 8cm pelvic mass   Postoperative Diagnosis: 1) 57 y.o. with 8cm left ovarian fibroma  Operation Performed: DaVinci robotic right salpingo-oophorectomy, with Doctor Secord performing the left salpino-oophorectomy   Indication: Pelvic pain and 8cm solid pelvic mass  Surgeon: Malachy Mood, MD  Co-Surgeon: Fredric Dine, MD  Anesthesia: General  Preoperative Antibiotics: 2g ancef  Estimated Blood Loss: 25 mL  IV Fluids: 1440mL  Urine Output:: 230mL  Drains or Tubes: none  Implants: none  Specimens Removed: Bilateral ovaries and fallopian tubes, pelvic washings, urine culture  Complications: none  Intraoperative Findings: Patient status post prior hysterectomy, on exam under anesthesia a large smooth bordered solid mass was palpable.  Intraoperatively the mass was arising from the left ovary, there was also a left hydrosalpinx.  The right ovary and tube were grossly normal.  The remainder of the abdomen did not show any evidence of pathology.  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  Patient Condition: stable  Procedure in Detail:  Patient was taken to the operating room where she was administered general anesthesia.  She was positioned in the dorsal lithotomy position utilizing Allen stirups, prepped and draped in the usual sterile fashion.  Prior to proceeding with procedure a time out was performed.  Attention was turned to the patient's pelvis.  An indwelling foley catheter was placed to decompress the patient's bladder with a sample of urine collected and sent for culture.  Exam under anesthesia was notable for a firm solid mass filling the lower pelvis.  Attention was turned to the patient's abdomen.  A spot 2cm superior to the umbilicus was infiltrated with 1% Sensorcaine, before making a stab incision using an 11 blade scalpel.  The subcutaneous  tissue was dissected off the fascia using a tonsil and S-retractors.  The fascia was grasped with two tonsils, tented up, and incised using a scalpel.  The superior and inferior apex of the incision were taged with a 0-Vicryl suture.  A 82mm Hassan port was then placed and pneumoperitoneum established.  The remaining ports were placed under direct visualization.  Two lateral 49mm robotic ports and a 82mm right upper quadrant assistant port all utilizing Versasteps.  Inspection of the abdomen revealed the above findings.  Pelvic washing were obtained.  The DaVinci robot was straight docked.  A fenestrated bipolar was placed arm 2 and a monopolar scissors in arm 1.   The left ovary containing the ovarian mass was elevated out of the pelvis.  The peritoneum was opened from the remnant of the the left round ligament cephalad to expose the IP ligament.  The ureter was visualized.   A window was created and the IP ligament was ligated using bipolar energy before transecting using the monopolar scissors. The ovary and tube were then transected from their remaining attachments to the left sidewall.    The left ovary and tube were then identified, the ureter was noted to run well away from the operative site.  The peritoneum was opened from the remnant of the the left round ligament cephalad to expose the IP ligament.  A window was created and the IP ligament was ligated using bipolar energy before transecting using the monopolar scissors.he ovary and tube were then transected from their remaining attachments to the left sidewall.  Pedicles were inspected noted to be hemostatic with pneumoperitoneum dropped.    A small 5vm pfannenstiel incision was made 2cm  above the pubic symphysis.  The right ovary was removed using a 28mm endocatch bag, the left ovary using a 4mm endocatch bag.  In order to to remove the left ovary the fascia was extended.  The left ovary was then cored out within the endocatch bag using a scalpel.  The  left ovary was then sent for frozen pathology.  The supraumbilical port was closed using 0 Vicryl for the fascia and a 4-0 vicryl for the skin.  The suprapubic incision also closed using a 0 Vicryl on the fascia, 4-0 vicryl on the skin.  All trocar sites were then dressed with surgical skin glue.  Sponge needle and instrument counts were correct time two.  The patient tolerated the procedure well and was taken to the recovery room in stable condition.

## 2018-01-04 LAB — CYTOLOGY - NON PAP

## 2018-01-04 LAB — URINE CULTURE
CULTURE: NO GROWTH
SPECIAL REQUESTS: NORMAL

## 2018-01-05 LAB — POCT I-STAT 4, (NA,K, GLUC, HGB,HCT)
GLUCOSE: 91 mg/dL (ref 65–99)
HEMATOCRIT: 45 % (ref 36.0–46.0)
HEMOGLOBIN: 15.3 g/dL — AB (ref 12.0–15.0)
POTASSIUM: 3.4 mmol/L — AB (ref 3.5–5.1)
SODIUM: 141 mmol/L (ref 135–145)

## 2018-01-08 ENCOUNTER — Telehealth: Payer: Self-pay

## 2018-01-08 NOTE — Telephone Encounter (Signed)
Dr. Dicie Beam has issued a pathology report from last weeks surgery. She has requested another request from [facility] surgical pathology.

## 2018-01-09 ENCOUNTER — Ambulatory Visit (INDEPENDENT_AMBULATORY_CARE_PROVIDER_SITE_OTHER): Payer: 59 | Admitting: Obstetrics and Gynecology

## 2018-01-09 VITALS — BP 140/90 | HR 72 | Ht 63.0 in | Wt 185.0 lb

## 2018-01-09 DIAGNOSIS — Z4889 Encounter for other specified surgical aftercare: Secondary | ICD-10-CM

## 2018-01-09 MED ORDER — OXYCODONE-ACETAMINOPHEN 5-325 MG PO TABS: 1.0000 | ORAL_TABLET | Freq: Four times a day (QID) | ORAL | 0 refills | Status: DC | PRN

## 2018-01-09 NOTE — Progress Notes (Signed)
Postoperative Follow-up Patient presents post op from robotic BSO 1weeks ago for adnexal mass.  Subjective: Patient reports marked improvement in her preop symptoms. Eating a regular diet without difficulty. Pain is controlled with current analgesics. Medications being used: ibuprofen (OTC) and narcotic analgesics including percocet.  Activity: normal activities of daily living.  Objective: Blood pressure 140/90, pulse 72, height 5\' 3"  (1.6 m), weight 185 lb (83.9 kg).  General: NAD HEENT: normocephalic, anicteric Pulmonary: CTAB Abdomen: soft, non-tender, non-distended incision sites D/C/I Ext: no edema   Admission on 01/03/2018, Discharged on 01/03/2018  Component Date Value Ref Range Status  . ABO/RH(D) 01/03/2018    Final                   Value:B NEG Performed at [facility], [address] [place], [place] [address]   . Potassium 01/03/2018 3.4   Final  . Specimen Description 01/03/2018    Final                   Value:URINE, CATHETERIZED Performed at [facility], [address] [place], [place] [address]   . Special Requests 01/03/2018    Final                   Value:NONE Normal Performed at [facility], [address] [place], [place] [address]   . Culture 01/03/2018    Final                   Value:NO GROWTH Performed at [facility], [place] [address] [place], [place] [address]   . Report Status 01/03/2018 01/04/2018 FINAL   Final  . CYTOLOGY - NON GYN 01/03/2018    Final                   Value:Cytology - Non PAP CASE: [id number] PATIENT: Sandra Carrillo Non-Gyn Cytology Report     SPECIMEN SUBMITTED: A. Pelvic washings  CLINICAL HISTORY: 8 cm mass likely from left ovary, prior hysterectomy in remote past  PRE-OPERATIVE DIAGNOSIS: Pelvic mass  POST-OPERATIVE DIAGNOSIS: None provided.     DIAGNOSIS: A. PELVIC WASHINGS: - NEGATIVE FOR MALIGNANCY. - MACROPHAGES, MESOTHELIAL CELLS, A FEW  NEUTROPHILS, AND A FEW LYMPHOCYTES.  Comment: Slides reviewed: 2 cytospin slide, 1 ThinPrep slide, and 1 cell block   GROSS DESCRIPTION: A. Specimen Labeled: pelvic washing Volume: 75 mL Description: translucent yellow fluid and a 1200 mL vacuum container Cell block(s): 1 and 1 ThinPrep Final Diagnosis performed by Bryan Lemma, MD.  Electronically signed 01/04/2018 4:14:09PM    The electronic signature indicates that the named Attending Pathologist has evaluated the specimen  Technical component performed at [place], [address] [place], [place] [address]                         [place] [facility]: [phone] Dir: Rush Farmer, MD, MMM  Professional component performed at [facility], [facility], [place], [place], [place] [address] Lab: [phone] Dir: Dellia Nims. Reuel Derby, MD    . SURGICAL PATHOLOGY 01/03/2018    Final                   Value:Surgical Pathology CASE: [id number] PATIENT: Sandra Carrillo Surgical Pathology Report     SPECIMEN SUBMITTED: A. Tube and ovary, left B. Tube and ovary, right  CLINICAL HISTORY: 8 cm mass likely from left ovary, prior hysterectomy in remote past  PRE-OPERATIVE DIAGNOSIS: Pelvic mass  POST-OPERATIVE DIAGNOSIS: None provided.     DIAGNOSIS: A. 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 - INCIDENTAL SEX CORD STROMAL TUMOR, 1.4 CM, SEE COMMENT.  FALLOPIAN TUBE, LEFT; SALPINGECTOMY: - ADHESIONS AND MILD HYDROSALPINX.  B. OVARY AND FALLOPIAN TUBE; SALPINGO-OOPHORECTOMY: - NO PATHOLOGIC CHANGES.  Comment: The incident small ovarian nodule is difficult to classify and the case is referred to Dr. Lattie Corns and associates, [facility], for gynecologic pathology consultation. A final diagnosis will be issued after their review.  Dr. Theora Gianotti was notified by [facility] in-basket of the request for outside consultation on this case                         .   GROSS  DESCRIPTION: A. Intraoperative Consultation:     Received: fresh     Specimen: left tube and ovary     Pathologic Evaluation: frozen section     Diagnosis: FS A1 and A2: Left ovary; oophorectomy:     - Large solid mass in fragments consistent with fibroma.     - 1.4 cm circumscribed nodule, favor sex cord stromal tumor.     - No evidence of carcinoma.     Communicated to: Dr. Georgianne Fick at 10:22 AM on 01/03/2018, Bryan Lemma M.D.     Tissue submitted: A1 and A2  A. Labeled: left tube and ovary Type of procedure: salpingo-oophorectomy Integrity: Multiple fragments Size of specimen:           Ovary: aggregate, 8.7 x 8.0 x 4.7 cm           Fallopian tube: 4.6 x 1.5 x 1.1 cm Weight of specimen: 102 grams Location of masses: ovary Number and size of masses: 2 separate masses, one large fragmented 7.8 x 6.2 x 5.1 cm; small intact nodule, 1.4 x 1.4 x 1.1 cm  Ovarian surface: all apparent surfaces are smooth pink to tan without excrescences Description of ma                         ss(es): larger mass: dense solid rubbery white trabecular; smaller round circumscribed nodule with solid tan cut surface Fallopian tube: purple disrupted, possible area of fimbria  Comment: 1 representative of large mass frozen as FSA1 and a representative of smaller mass frozen as FSA2, following frozen section tissue entirely formalin fixed  Block summary: 1-2-frozen section remnants 3-4-remaining smaller nodule 5-8-representative larger mass 9-representative grossly uninvolved ovary 10-central cross-section and longitudinal possible fimbriated tissue  B. Labeled: right tube and ovary Type of procedure: salpingo-oophorectomy Integrity: intact Size of specimen:           Ovary: 2.9 x 2.4 x 1.3 cm           Fallopian tube: 4.9 x 1.1 x 1.0 cm Weight of specimen: 9 grams Location of mass(es): no masses present Ovarian surface: multilobulated tan white Description of ovary parenchyma : heterogeneous  tan white Fallopian tube: Fimbriated purple to tan with hemorrha                         ge  Block summary: 1-2-representative ovary 3-representative cross-section and longitudinal fimbriated end fallopian tube  Final Diagnosis performed by Bryan Lemma, MD.  Electronically signed 01/08/2018 4:28:47PM    The electronic signature indicates that the named Attending Pathologist has evaluated the specimen  Technical component performed at [facility], [place]  [address] [place], [place] [address] Lab: [phone] Dir: Rush Farmer, MD, MMM  Professional component performed at [facility], [facility], [place] [place], [place], [place] [address] Lab: [phone] Dir: Dellia Nims. Rubinas, MD    . Sodium 01/03/2018 141  135 - 145 mmol/L Final  . Potassium 01/03/2018 3.4* 3.5 - 5.1 mmol/L Final  . Glucose, Bld 01/03/2018 91  65 - 99 mg/dL Final  . HCT 01/03/2018 45.0  36.0 - 46.0 % Final  . Hemoglobin 01/03/2018 15.3* 12.0 - 15.0 g/dL Final    Assessment: 57 y.o. s/p robotic BSO stable  Plan: Patient has done well after surgery with no apparent complications.  I have discussed the post-operative course to date, and the expected progress moving forward.  The patient understands what complications to be concerned about.  I will see the patient in routine follow up, or sooner if needed.    Activity plan: No restriction.  - Pathology review at [place] pending for sex cord-stromal component of tumor - refill percocet   Malachy Mood, MD, [place], [facility] 01/09/2018, 12:33 PM  [facility] pathology review pending for sex cord-stromal component  Refill percoce

## 2018-01-18 ENCOUNTER — Other Ambulatory Visit: Payer: Self-pay | Admitting: Internal Medicine

## 2018-01-18 DIAGNOSIS — Z1231 Encounter for screening mammogram for malignant neoplasm of breast: Secondary | ICD-10-CM

## 2018-01-18 LAB — SURGICAL PATHOLOGY

## 2018-01-29 ENCOUNTER — Encounter: Payer: Self-pay | Admitting: Obstetrics and Gynecology

## 2018-01-29 ENCOUNTER — Ambulatory Visit (INDEPENDENT_AMBULATORY_CARE_PROVIDER_SITE_OTHER): Payer: 59 | Admitting: Obstetrics and Gynecology

## 2018-01-29 VITALS — BP 124/90 | HR 86 | Ht 63.0 in | Wt 190.0 lb

## 2018-01-29 DIAGNOSIS — Z4889 Encounter for other specified surgical aftercare: Secondary | ICD-10-CM

## 2018-01-31 NOTE — Progress Notes (Signed)
      Postoperative Follow-up Patient presents post op from robotic BSO 40months ago for adnexal mass.  Subjective: Patient reports no improvement in her preop symptoms. Eating a regular diet without difficulty. Pain is controlled with current analgesics. Medications being used: NSAID.  Activity: normal activities of daily living.  Objective: Blood pressure 124/90, pulse 86, height 5\' 3"  (1.6 m), weight 190 lb (86.2 kg).  Gen: NAD HEENT: normocephalic, anicteric Abdomen: soft, non-tender, non-distended, incision D/C/I Ext: no edema  Assessment: 57 y.o. s/p robotice BSO stable  Plan: Patient has done well after surgery with no apparent complications.  I have discussed the post-operative course to date, and the expected progress moving forward.  The patient understands what complications to be concerned about.  I will see the patient in routine follow up, or sooner if needed.    Activity plan: No restriction.  Final pathology review by [facility] favors benign process  Continued back pain, given chronicity this was likely unrelated to her ovarian mass, and is unlikely related to her surgery.  Encouraged to continue following up with her established provider for management   Malachy Mood, MD, [place], [facility] 01/31/2018, 3:30 AM

## 2018-02-07 ENCOUNTER — Inpatient Hospital Stay: Payer: 59

## 2018-02-07 ENCOUNTER — Inpatient Hospital Stay: Payer: 59 | Attending: Obstetrics and Gynecology | Admitting: Obstetrics and Gynecology

## 2018-02-07 VITALS — BP 142/88 | HR 85 | Temp 96.8°F | Resp 18 | Ht 63.0 in | Wt 187.5 lb

## 2018-02-07 DIAGNOSIS — Z90722 Acquired absence of ovaries, bilateral: Secondary | ICD-10-CM

## 2018-02-07 DIAGNOSIS — D279 Benign neoplasm of unspecified ovary: Secondary | ICD-10-CM | POA: Insufficient documentation

## 2018-02-07 DIAGNOSIS — D489 Neoplasm of uncertain behavior, unspecified: Secondary | ICD-10-CM | POA: Insufficient documentation

## 2018-02-07 DIAGNOSIS — Z90710 Acquired absence of both cervix and uterus: Secondary | ICD-10-CM | POA: Diagnosis not present

## 2018-02-07 NOTE — Progress Notes (Signed)
Gynecologic Oncology Interval Visit   Referring Provider: Dr. Tammi Klippel  Chief Concern: postoperative visit.  Subjective:  Sandra Carrillo is a 57 y.o. P2 female who is seen for postoperative evaluation.   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 Surgical findings included mild pelvic adhesive disease. There was no intra-abdominal evidence of metastatic disease.   Pathology: DIAGNOSIS:  A. 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  - INCIDENTAL SEX CORD STROMAL TUMOR, 1.4 CM, SEE COMMENT.   FALLOPIAN TUBE, LEFT; SALPINGECTOMY:  - ADHESIONS AND MILD HYDROSALPINX.   B. OVARY AND FALLOPIAN TUBE; SALPINGO-OOPHORECTOMY:  - NO PATHOLOGIC CHANGES.   Pelvic washings negative  Today, patient reports she still has back and left pelvic pain. She has multiple complaints.   Gynecology-Oncology History:  Initially, patient was initially seen in consultation from Dr. Wynetta Emery at [facility] for painful pelvic mass. She had had abdominal pain for approximately 4 months and was treated with Cipro and Flagyl for possible diverticulitis. She saw improvement in symptoms but no resolution. Urinalysis showed UTI. She had intermittent nausea w/o vomiting. Occassional alternating diarrhea and constipation as well as back pain. Some trouble emptying bladder. No fevers or chills. Reports recent labs were normal. in consultation from Dr. Wynetta Emery for painful pelvic mass.  CA125 = 7.8, CA19-9 = 32, HE4 - 94.1  CT scan 12/14/17 IMPRESSION: -Large LEFT renal calculus 16 x 16 x 14 mm with mild enhancement of the walls of a slightly prominent LEFT renal pelvis question related to urinary tract infection; recommend correlation with urinalysis. -Complicated intermediate attenuation mass in the pelvis 8.0 x 6.0 x 5.6 cm in size, likely of LEFT  ovarian origin, question ovarian neoplasm; further assessment by transabdominal and transvaginal sonography of the pelvis recommended to exclude neoplasm. Suspected LEFT hydrosalpinx. -Indeterminate 1.8 x 1.5 cm splenic lesion; followup characterization by MR recommended. -Omitted from the initial dictation is presence of a calcification and questionable area of wall thickening at the bladder base; on sagittal views, is of uncertain whether this represents indentation of the bladder by adjacent soft tissue or a small mass 19 x 9 x 12 mm at the inferior bladder wall. Followup cystoscopic evaluation recommended to exclude tumor at bladder base.  She had lithotripsy and surgery about 15+ years ago at [facility] for kidney stones.  No problems since then. Prior hysterectomy.   She saw Dr. Erlene Quan with Urology and undergone cystoscopy. < 1 cm bilobed submucosal mass situated at the posterior bladder neck was found. Thought to be extrinsic compression from known pelvic mass  Problem List: Patient Active Problem List   Diagnosis Date Noted  . 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 Pelvic mass in female     Past Medical History: Past Medical History:  Diagnosis Date  . Arthritis   . Asthma   . Chronic kidney disease    kidney stones  . Degenerative disc disease, lumbar   . Depression   . Heart murmur    stated in high school but nothing since  . History of kidney stones 2000   surgery in the past  . Hypertension   . Migraine     Past Surgical History: Past Surgical History:  Procedure Laterality Date  .  carpal tunnel surgery    . ABDOMINAL HYSTERECTOMY     partial 27 years ago  . ingrown  toenail    . LITHOTRIPSY    . removal of fibroid     before hyserectomy  . ROBOTIC ASSISTED BILATERAL SALPINGO OOPHERECTOMY Bilateral 01/03/2018   Procedure: ROBOTIC ASSISTED BILATERAL SALPINGO OOPHORECTOMY;  Surgeon: Gillis Ends, MD;  Location: [facility] ORS;  Service: Gynecology;  Laterality:  Bilateral;  . TUBAL LIGATION  1998    OB History:  OB History  No data available    Family History: No family history of breast, kidney or prostate cancer per patient on 02/07/2018 Family History  Problem Relation Age of Onset  . Breast cancer Neg Hx   . Kidney cancer Neg Hx   . Kidney disease Neg Hx   . Prostate cancer Neg Hx     Social History: Social History   Socioeconomic History  . Marital status: Married    Spouse name: Sandra Carrillo  . Number of children: 4  . Years of education: Not on file  . Highest education level: Not on file  Social Needs  . Financial resource strain: Not on file  . Food insecurity - worry: Not on file  . Food insecurity - inability: Not on file  . Transportation needs - medical: Not on file  . Transportation needs - non-medical: Not on file  Occupational History  . Not on file  Tobacco Use  . Smoking status: Current Every Day Smoker    Packs/day: 1.00    Years: 20.00    Pack years: 20.00    Types: Cigarettes  . Smokeless tobacco: Never Used  Substance and Sexual Activity  . Alcohol use: No    Frequency: Never  . Drug use: Not on file  . Sexual activity: Yes  Other Topics Concern  . Not on file  Social History Narrative  . Not on file    Allergies: Allergies  Allergen Reactions  . Lisinopril Cough    Coughed so bad that she had to go to ER.    . Sulfa Antibiotics Hives and Itching    Current Medications: Current Outpatient Medications  Medication Sig Dispense Refill  . amLODipine (NORVASC) 5 MG tablet Take 5 mg by mouth daily.  5  . aspirin EC 81 MG tablet Take 81 mg by mouth daily.    Marland Kitchen docusate sodium (COLACE) 100 MG capsule Take 100 mg by mouth 3 (three) times daily.     . Fluticasone-Salmeterol (ADVAIR DISKUS) 100-50 MCG/DOSE AEPB Inhale 1 puff into the lungs 2 (two) times daily as needed (shortness for breath).     . gabapentin (NEURONTIN) 300 MG capsule Take 300-600 mg by mouth 2 (two) times daily. Takes 300 mg in the  morning and 600 mg at bedtime    . oxyCODONE-acetaminophen (PERCOCET/ROXICET) 5-325 MG tablet Take 1-2 tablets by mouth every 6 (six) hours as needed. 20 tablet 0  . polyethylene glycol (MIRALAX / GLYCOLAX) packet Take 17 g by mouth daily as needed for mild constipation.    . propranolol (INDERAL) 40 MG tablet Take 40 mg by mouth 2 (two) times daily.    Marland Kitchen torsemide (DEMADEX) 10 MG tablet Take 10 mg by mouth daily.    . traMADol (ULTRAM) 50 MG tablet Take 50 mg by mouth 3 (three) times daily. Also used for migraines  0  . ibuprofen (ADVIL,MOTRIN) 600 MG tablet Take 1 tablet (600 mg total) by mouth every 6 (six) hours as needed. (Patient not taking: Reported on 02/07/2018) 60 tablet 3   No current facility-administered medications for this visit.  Review of Systems General: fatigue and weakness  HEENT: no complaints  Lungs: no complaints  Cardiac: no complaints  GI: no complaints  GU: abdominal pain, decreased appetite, constipation,   Musculoskeletal: back pain  Extremities: no complaints  Skin: no complaints  Neuro: numbness and tingling  Endocrine: no complaints  Psych: feeling sad       Objective:  Physical Examination:  BP (!) 142/88   Pulse 85   Temp (!) 96.8 F (36 C) (Tympanic)   Resp 18   Ht 5\' 3"  (1.6 m)   Wt 187 lb 8 oz (85 kg)   BMI 33.21 kg/m    ECOG Performance Status: 1 - Symptomatic but completely ambulatory  General appearance: alert, cooperative and appears stated age HEENT: ATNC Abdomen: Soft, non tender, non distended, no ascites, no masses or hepatomegaly. No hernias Extremities: extremities normal, atraumatic, no cyanosis or edema Skin exam - normal coloration and turgor, no rashes, no suspicious skin lesions noted. Incisions well healed.  Neurological exam reveals alert, oriented, normal speech, no focal findings or movement disorder noted.  Pelvic: deferred     Assessment:  LAINY WROBLESKI is a 57 y.o. 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  Chronic back pain with h/o DJD  Left abdominal pelvic pain possibly due to adhesive disease versus referred back from DJD  Remote history prior hysterectomy.       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 The tumor was noted to have an elevated mitotic rate and at least focal tumor necrosis in the form of apoptosis, features which have been associated with more aggressive behavior in these tumors. A reassuring feature, however, is  the small size of the tumor.   I am recommending evaluation for germline DICER1 mutations which have been associated with ovarian Sertoli-Leydig cell tumors. These mutations are also assocaited with pleuropulmonary blastoma (PPB), lung cysts, cystic nephroma, thyroid nodular hyperplasia and differentiated carcinomas, and cervical sarcoma botryoides. She does not have these medical issues and is not aware of these issues in her other family members.  We reviewed need for surveillance; concerning symptoms, and plan for follow up.   Evaluation with physical examination every three to four months for the first two years; then every six months thereafter until five years. She did not have sex cord stromal tumor type biomarkers. We will order AFP, E2, inhibin B, testosterone, and DHEA which may be elevated in these tumors for a baseline assessment. If negative no further evaluation will be performed. If elevated then CT scan for assessment. Routine use of imaging studies is not recommended and will be ordered if recurrence is suspected based on symptoms and exam  The patient's diagnosis, an  outline of the further diagnostic and laboratory studies which will be required, the recommendation, and alternatives were discussed.  All questions were answered to the patient's satisfaction.  A total of 60 minutes total were spent with the patient/family today; 20% was spent in education, counseling and coordination of care for pelvic mass.    Gillis Ends, MD  CC:  Referring Provider: Dr. Orlando Penner, MD

## 2018-02-07 NOTE — Progress Notes (Signed)
Patient c/o fatigue, weaknes abdominal pain ( 9) decrease appetite with N&V, constipation,numbeness and tingling noted to left leg, the patient also report having depression ( patient refused counseling service).

## 2018-02-08 ENCOUNTER — Ambulatory Visit
Admission: RE | Admit: 2018-02-08 | Discharge: 2018-02-08 | Disposition: A | Discharge status: Home / Self Care | Payer: 59 | Source: Ambulatory Visit | Attending: Internal Medicine | Admitting: Internal Medicine

## 2018-02-08 DIAGNOSIS — Z1231 Encounter for screening mammogram for malignant neoplasm of breast: Secondary | ICD-10-CM | POA: Insufficient documentation

## 2018-02-08 LAB — INHIBIN B: Inhibin B: <7 pg/mL (ref 0.0–16.9)

## 2018-02-08 LAB — ESTRADIOL: Estradiol: 9 pg/mL

## 2018-02-08 LAB — TESTOSTERONE: TESTOSTERONE: 19 ng/dL (ref 3–41)

## 2018-02-08 LAB — AFP TUMOR MARKER: AFP, Serum, Tumor Marker: 4.9 ng/mL (ref 0.0–8.3)

## 2018-02-08 LAB — DHEA-SULFATE: DHEA-SO4: 38.6 ug/dL — ABNORMAL LOW (ref 41.2–243.7)

## 2018-02-09 ENCOUNTER — Telehealth: Payer: Self-pay | Admitting: Nurse Practitioner

## 2018-02-09 NOTE — Telephone Encounter (Signed)
Called patient to discuss results from blood work on 02/07/18 which were negative. Patient complains of back pain, which is chronic, and has appointment to see PCP later today. All questions answered.

## 2018-02-12 ENCOUNTER — Other Ambulatory Visit: Payer: Self-pay | Admitting: Physician Assistant

## 2018-02-12 DIAGNOSIS — D7389 Other diseases of spleen: Secondary | ICD-10-CM

## 2018-02-12 DIAGNOSIS — R1012 Left upper quadrant pain: Secondary | ICD-10-CM

## 2018-02-12 DIAGNOSIS — D739 Disease of spleen, unspecified: Principal | ICD-10-CM

## 2018-02-13 ENCOUNTER — Telehealth: Payer: Self-pay

## 2018-02-13 NOTE — Telephone Encounter (Signed)
I have spoken with Sandra Carrillo regarding genetic testing for DICER1. There is no charge for her to speak with genetic counselor. Sandra Carrillo would like to pursue talking with counselor. From there they can discuss the cost and her preference for pursuing. Referral sent to [facility]. Oncology Nurse Navigator Documentation  Navigator Location: [facility] (02/13/18 1000)   )Navigator Encounter Type: Telephone;Genetics (02/13/18 1000) Telephone: Outgoing Call (02/13/18 1000)                                                  Time Spent with Patient: 15 (02/13/18 1000)

## 2018-02-15 ENCOUNTER — Encounter: Payer: Self-pay | Admitting: Obstetrics and Gynecology

## 2018-02-15 ENCOUNTER — Ambulatory Visit (INDEPENDENT_AMBULATORY_CARE_PROVIDER_SITE_OTHER): Payer: 59 | Admitting: Obstetrics and Gynecology

## 2018-02-15 VITALS — BP 136/96 | HR 99 | Ht 63.0 in | Wt 185.0 lb

## 2018-02-15 DIAGNOSIS — D489 Neoplasm of uncertain behavior, unspecified: Secondary | ICD-10-CM

## 2018-02-15 DIAGNOSIS — Z09 Encounter for follow-up examination after completed treatment for conditions other than malignant neoplasm: Secondary | ICD-10-CM

## 2018-02-15 NOTE — Progress Notes (Signed)
Postoperative Follow-up Patient presents post op from BSO 6weeks ago for right ovarian fibroma with 1.4cm sex cord stromal tumor.  Subjective: Patient reports some improvement in her preop symptoms. Eating a regular diet without difficulty. Right sided pain persists despite removal of pelvic mass, curently undergoing evaluation for splenic lesion.  Activity: normal activities of daily living.  Objective: Blood pressure (!) 136/96, pulse 99, height 5\' 3"  (1.6 m), weight 185 lb (83.9 kg).  Gen: NAD HEENT: normocephalic, anicteric Pulmonary: no increased work of breathing Abdomen: soft, non-distended, non-tender, trocar sites D/C/I GU: normal external female genitalia, absent uterus and cervix, no adnexal masses or tenderness Ext: no edema  Appointment on 02/07/2018  Component Date Value Ref Range Status  . DHEA-SO4 02/07/2018 38.6* 41.2 - 243.7 ug/dL Final   Comment: (NOTE) Performed At: [facility] [place], [place] [id number] Rush Farmer MD [id number] Performed at [facility], [address], [place], [place] [address]   . Testosterone 02/07/2018 19  3 - 41 ng/dL Final   Comment: (NOTE) Performed At: [facility] [place], [place] [id number] Rush Farmer MD [id number] Performed at [facility], [address], [place], [place] [address]   . Inhibin B 02/07/2018 <7.0  0.0 - 16.9 pg/mL Final   Comment: (NOTE) Results for this test are for research purposes only by the assay's manufacturer.  The performance characteristics of this product have not been established.  Results should not be used as a diagnostic procedure without confirmation of the diagnosis by another medically established diagnostic product or procedure. Performed At: [facility] [place], [place] [id number] Rush Farmer MD [id number] Performed at [facility], [address], [place],  [place] [address]   . Estradiol 02/07/2018 9.0  pg/mL Final   Comment: (NOTE)                    Adult Female:                      Follicular phase   73.4 -   166.0                      Ovulation phase    85.8 -   498.0                      Luteal phase       43.8 -   211.0                      Postmenopausal     <6.0 -    54.7                    Pregnancy                      1st trimester     215.0 - >4300.0                    Girls (1-10 years)    6.0 -    27.0 Roche ECLIA methodology Performed At: [facility] [name], [place] [id number] Rush Farmer MD [id number] Performed at [facility], [address], [place], [place] [address]   . AFP, Serum, Tumor Marker 02/07/2018 4.9  0.0 - 8.3 ng/mL Final   Comment: (NOTE) Roche Diagnostics Electrochemiluminescence Immunoassay (ECLIA) Values  obtained with different assay methods or kits cannot be used interchangeably.  Results cannot be interpreted as absolute evidence of the presence or absence of malignant disease. This test is not interpretable in pregnant females. Performed At: [facility] [place], [place] [id number] Rush Farmer MD [id number] Performed at [facility], [address] [place], [place] [address]     Assessment: 57 y.o. s/p BSO stable  Plan: Patient has done well after surgery with no apparent complications.  I have discussed the post-operative course to date, and the expected progress moving forward.  The patient understands what complications to be concerned about.  I will see the patient in routine follow up, or sooner if needed.    Activity plan: No restriction.  Return in about 1 year (around 02/16/2019) for annual.    Malachy Mood, MD, [place], [facility] 02/15/2018, 1:07 PM

## 2018-02-16 ENCOUNTER — Telehealth: Payer: Self-pay | Admitting: Genetic Counselor

## 2018-02-16 NOTE — Telephone Encounter (Addendum)
Patient called back and scheduled appointment for Monday, 02/19/18 at noon.  -----------------------------------------------------------------  Dr. Theora Gianotti is referring Ms. Mozingo for genetic counseling due to a personal history of a sex cord stromal tumor. I left her a message to call and schedule this telegenetics visit to be done by phone at her convenience.   Steele Berg, [place], [place] Genetic Counselor Phone: [phone]

## 2018-02-16 NOTE — Telephone Encounter (Signed)
I left her a message to get this scheduled. Thanks.

## 2018-02-19 ENCOUNTER — Encounter: Payer: Self-pay | Admitting: Genetic Counselor

## 2018-02-19 ENCOUNTER — Telehealth: Payer: Self-pay | Admitting: Genetic Counselor

## 2018-02-19 NOTE — Telephone Encounter (Signed)
Cancer Genetics            Telegenetics Initial Visit    Patient Name: Sandra Carrillo Patient DOB: 12/26/1960 Patient Age: 57 y.o. Phone Call Date: 02/19/2018  Referring Provider: Shella Maxim, MD  Reason for Visit: Evaluate for hereditary susceptibility to cancer    Assessment and Plan:  . Ms. 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  . Testing is recommended to determine whether she has a pathogenic mutation that will impact her screening and risk-reduction for future cancer. A negative result will be reassuring.  . Sandra Carrillo wished to pursue genetic testing, but wanted to know whether her insurance will cover the cost of testing. Once that information is obtained, she will schedule a lab visit for a blood sample. Analysis will include the 83 genes on [facility]'s Multi-Cancer panel (ALK, APC, ATM, AXIN2, BAP1, BARD1, BLM, BMPR1A, BRCA1, BRCA2, BRIP1, CASR, CDC73, CDH1, CDK4, CDKN1B, CDKN1C, CDKN2A, CEBPA, CHEK2, CTNNA1, DICER1, DIS3L2, EGFR, EPCAM, FH, FLCN, GATA2, GPC3, GREM1, HOXB13, HRAS, KIT, MAX, MEN1, MET, MITF, MLH1, MSH2, MSH3, MSH6, MUTYH, NBN, NF1, NF2, NTHL1, PALB2, PDGFRA, PHOX2B, PMS2, POLD1, POLE, POT1, PRKAR1A, PTCH1, PTEN, RAD50, RAD51C, RAD51D, RB1, RECQL4, RET, RUNX1, SDHA, SDHAF2, SDHB, SDHC, SDHD, SMAD4, SMARCA4, SMARCB1, SMARCE1, STK11, SUFU, TERC, TERT, TMEM127, TP53, TSC1, TSC2, VHL, WRN, WT1).  . If she proceeds, results should be available in approximately 2-3 weeks after [facility] receives her sample, at which point we will contact her and address implications for her as well as address genetic testing for at-risk family members, if needed.     Dr. Grayland Ormond was available for questions concerning this case. Total time spent by counseling by phone was approximately 25 minutes.    _____________________________________________________________________   History of Present Illness: Sandra Carrillo, a 57 y.o. female, was referred for genetic counseling to discuss the possibility of a hereditary predisposition to cancer and discuss whether genetic testing is warranted. This was a telegenetics visit via phone.  Sandra Carrillo had recently undergone a BSO due to a left ovarian mass. 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    Past Medical History:  Diagnosis Date  . Arthritis   . Asthma   . Chronic kidney disease    kidney stones  . Degenerative disc disease, lumbar   . Depression   . Heart murmur    stated in high school but nothing since  . History of kidney stones 2000   surgery in the past  . Hypertension   . Migraine     Past Surgical History:  Procedure Laterality Date  .  carpal tunnel surgery    . ABDOMINAL HYSTERECTOMY     partial 27 years ago  . ingrown toenail    . LITHOTRIPSY    . removal of fibroid     before hyserectomy  . ROBOTIC ASSISTED BILATERAL SALPINGO OOPHERECTOMY Bilateral 01/03/2018   Procedure: ROBOTIC ASSISTED BILATERAL SALPINGO OOPHORECTOMY;  Surgeon: Gillis Ends, MD;  Location: [facility] ORS;  Service: Gynecology;  Laterality: Bilateral;  . TUBAL LIGATION  1998    Family History: Significant diagnoses include the following:  Family History  Problem Relation Age of Onset  .  Leukemia Other 61       daughter of sister  . Prostate cancer Maternal Grandfather        deceased 67  . Cancer Paternal Uncle        unk. type  . Cancer Paternal Aunt        unk. type  . Prostate cancer Maternal Uncle   . Prostate cancer Maternal Uncle   . Breast cancer Neg Hx   . Kidney cancer Neg Hx   . Kidney disease Neg Hx     Additionally, Sandra Carrillo has 2 sons (ages 67 and 19). She has a brother (age 90) and a sister (age  77). Her mother died at 49 due to an MI. Her mother had a total of 9 brothers. Her father died in his 21s of a gunshot wound. He had a total of 6 brothers and a sister.  Sandra Carrillo ancestry is African American. There is no known Jewish ancestry and no consanguinity.  Discussion: We reviewed the characteristics, features and inheritance patterns of hereditary cancer syndromes. We discussed her risk of harboring a mutation in the context of her personal and family history. We discussed the process of genetic testing, insurance coverage and implications of results: positive, negative and variant of unknown significance (VUS).   There was concern raised regarding a mutation in [place]. In addition to many other tumor types, the ovarian sex cord-stromal tumors seen with this predisposition tend to be Sertoli-Leydig cell tumor, juvenile granulosa cell tumor, and gynandroblastoma.  Sandra Carrillo questions were answered to her satisfaction today and she is welcome to call with any additional questions or concerns. Thank you for the referral and allowing [place] to share in the care of your patient.    Steele Berg, MS, [place] Certified Genetic Counselor phone: [phone]

## 2018-02-20 ENCOUNTER — Other Ambulatory Visit: Payer: Self-pay | Admitting: Genetic Counselor

## 2018-02-20 DIAGNOSIS — D489 Neoplasm of uncertain behavior, unspecified: Secondary | ICD-10-CM

## 2018-02-21 ENCOUNTER — Inpatient Hospital Stay: Payer: 59 | Attending: Obstetrics and Gynecology

## 2018-02-21 ENCOUNTER — Ambulatory Visit: Payer: 59

## 2018-02-28 ENCOUNTER — Other Ambulatory Visit: Payer: Self-pay | Admitting: Internal Medicine

## 2018-02-28 DIAGNOSIS — R0781 Pleurodynia: Secondary | ICD-10-CM

## 2018-03-05 ENCOUNTER — Telehealth: Payer: Self-pay | Admitting: Genetic Counselor

## 2018-03-05 NOTE — Telephone Encounter (Signed)
Cancer Genetics             Telegenetics Results Disclosure   Patient Name: Sandra Carrillo Patient DOB: 09/17/1961 Patient Age: 57 y.o. Phone Call Date: 03/05/2018  Referring Provider: Santiago Glad, MD    [name] was called today to discuss genetic test results. Please see the Genetics telephone note from 02/19/2018 for a detailed discussion of her personal and family histories and the recommendations provided.  Genetic Testing: At the time of Sandra Carrillo's telegenetics visit, she decided to pursue genetic testing of multiple genes associated with hereditary susceptibility to cancer. Testing included sequencing and deletion/duplication analysis. Testing did not reveal a pathogenic mutation in any of the genes analyzed.  A copy of the genetic test report will be scanned into Epic under the Media tab.  The genes analyzed were the 83 genes on [facility]'s Multi-Cancer panel (ALK, APC, ATM, AXIN2, BAP1, BARD1, BLM, BMPR1A, BRCA1, BRCA2, BRIP1, CASR, CDC73, CDH1, CDK4, CDKN1B, CDKN1C, CDKN2A, CEBPA, CHEK2, CTNNA1, DICER1, DIS3L2, EGFR, EPCAM, FH, FLCN, GATA2, GPC3, GREM1, HOXB13, HRAS, KIT, MAX, MEN1, MET, MITF, MLH1, MSH2, MSH3, MSH6, MUTYH, NBN, NF1, NF2, NTHL1, PALB2, PDGFRA, PHOX2B, PMS2, POLD1, POLE, POT1, PRKAR1A, PTCH1, PTEN, RAD50, RAD51C, RAD51D, RB1, RECQL4, RET, RUNX1, SDHA, SDHAF2, SDHB, SDHC, SDHD, SMAD4, SMARCA4, SMARCB1, SMARCE1, STK11, SUFU, TERC, TERT, TMEM127, TP53, TSC1, TSC2, VHL, WRN, WT1).  Since the current test is not perfect, it is possible that there may be a gene mutation that current testing cannot detect, but that chance is small. It is possible that a different genetic factor, which has not yet been discovered or is not on this panel, is responsible for the cancer diagnoses in the family. Again, the likelihood of this is low. No additional testing is recommended at this time for Sandra Carrillo.  Three Variants of Uncertain Significance were  detected: POLD1 c.2467C>T (p.Arg823Cys), RECQL4 c.3172C>G (p.Arg1058Gly), and WRN c.1027G>A (p.Glu343Lys). This is still considered a normal result. While at this time, it is unknown if this finding is associated with increased cancer risk, the majority of these variants get reclassified to be inconsequential. Medical management should not be based on this finding. With time, we suspect the lab will determine the significance, if any. If we do learn more about it, we will try to contact Sandra Carrillo to discuss it further. It is important to stay in touch with [place] periodically and keep the address and phone number up to date.  Cancer Screening: These results suggest that [name]'s tumor was most likely not due to an inherited predisposition. She is recommended to follow the cancer screening guidelines provided by her physician.   Family Members: Family members are recommended to speak with their own providers about appropriate cancer screenings.  Any relative who had cancer at a young age or had a particularly rare cancer may also wish to pursue genetic testing. Genetic counselors can be located in other cities, by visiting the website of the [employer] ([employer]ist[employer]se) and Field seismologist for a Dietitian by zip code.   Family members are not recommended to get tested for the above VUSs outside of a research protocol as this finding has no implications for their medical management.     Lastly, cancer genetics is a rapidly advancing field and it is possible that new genetic tests will be appropriate for Sandra Carrillo in the future.  We encourage Sandra Carrillo to remain in contact with Genetics on an annual basis so her personal and family histories can be updated.    Steele Berg, MS, [place] Certified Genetic Counselor phone: [phone]

## 2018-03-08 ENCOUNTER — Encounter
Admission: RE | Admit: 2018-03-08 | Discharge: 2018-03-08 | Disposition: A | Discharge status: Home / Self Care | Payer: 59 | Source: Ambulatory Visit | Attending: Internal Medicine | Admitting: Internal Medicine

## 2018-03-08 DIAGNOSIS — R0781 Pleurodynia: Secondary | ICD-10-CM | POA: Diagnosis not present

## 2018-03-08 MED ORDER — TECHNETIUM TC 99M MEDRONATE IV KIT
23.1400 | PACK | Freq: Once | INTRAVENOUS | Status: AC | PRN
  Administered 2018-03-08: 23.14 via INTRAVENOUS

## 2018-03-22 ENCOUNTER — Ambulatory Visit: Payer: 59 | Admitting: Urology

## 2018-03-29 ENCOUNTER — Ambulatory Visit: Payer: 59 | Admitting: Urology

## 2018-03-29 ENCOUNTER — Encounter: Payer: Self-pay | Admitting: Urology

## 2018-03-29 VITALS — BP 152/86 | HR 84 | Ht 63.0 in | Wt 182.0 lb

## 2018-03-29 DIAGNOSIS — N398 Other specified disorders of urinary system: Secondary | ICD-10-CM

## 2018-03-29 DIAGNOSIS — N200 Calculus of kidney: Secondary | ICD-10-CM | POA: Diagnosis not present

## 2018-03-29 DIAGNOSIS — R109 Unspecified abdominal pain: Secondary | ICD-10-CM

## 2018-03-29 NOTE — Progress Notes (Signed)
03/29/2018 4:57 PM   Sandra Carrillo 1961/01/04 [id number]  Referring provider: Kirk Ruths, MD [place] [facility] [place], [place] [address]  Chief Complaint  Patient presents with  . Nephrolithiasis    HPI: 57 year old female who returns today for urology follow-up of an incidental left lower pole stone calculus.  When she presented in 11/2017, she was also being actively evaluated by GYN oncology for a left adnexal mass measuring 8 x 6 x 5.6 cm.  Cystoscopy revealed that the lesion in question was extrinsic to the bladder. 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  She will need further surveillance but no other additional treatment at this time.  She is also found to have a incidental large left lower pole renal calculus measuring 16 x 16 x 14 with mild urothelial enhancement of the left renal pelvis.  She does have a personal history of kidney stones including a history of failed shockwave lithotripsy followed by what sounds like possibly a PCNL greater than 15 years ago [facility] on the left.  She reports that the pain and discomfort she is experiencing currently is not at all similar to her kidney stone episode in the past.  This stone is new since 2014.  She returns today primarily to discuss management of her stone.  She reports that she called her office 2 weeks after her GYN surgery with persistent flank pain and reports being told that I "did not want to see her", however, there is no documentation of this.  She is very upset about this fact.  To the best of my knowledge, this was not the case or perhaps misconstrued as I had anticipated seeing her back in the office after her GYN surgery to arrange for definitive management of her nonobstructing stone.  She reports that she has been seen in multiple locations including  [facility] for further evaluation of the stone since her  surgery in January.  She reports that several x-rays were taken at outside facilities.  On one occasion specifically, she was seen and evaluated for rib pain and underwent a rib series of films.  Unfortunately, I do not access to these films.  She is adamant that a stent that was placed many years ago remains in place and was never removed but there is no radiographic or cystoscopic evidence of this.  She continues to have fairly significant left flank pain.  She does have a personal history of chronic back pain.  She takes tramadol chronically but does not like to be on pain medicine per her report.  She did not ask for prescription today in the office.  She denies any significant urinary symptoms today.  No dysuria or gross hematuria.  No fevers or chills.   PMH: Past Medical History:  Diagnosis Date  . Arthritis   . Asthma   . Chronic kidney disease    kidney stones  . Degenerative disc disease, lumbar   . Depression   . Heart murmur    stated in high school but nothing since  . History of kidney stones 2000   surgery in the past  . Hypertension   . Migraine     Surgical History: Past Surgical History:  Procedure Laterality Date  .  carpal tunnel surgery    . ABDOMINAL HYSTERECTOMY     partial 27 years ago  . ingrown toenail    .  LITHOTRIPSY    . removal of fibroid     before hyserectomy  . ROBOTIC ASSISTED BILATERAL SALPINGO OOPHERECTOMY Bilateral 01/03/2018   Procedure: ROBOTIC ASSISTED BILATERAL SALPINGO OOPHORECTOMY;  Surgeon: Gillis Ends, MD;  Location: [facility] ORS;  Service: Gynecology;  Laterality: Bilateral;  . TUBAL LIGATION  1998    Home Medications:  Allergies as of 03/29/2018      Reactions   Lisinopril Cough   Coughed so bad that she had to go to ER.    Sulfa Antibiotics Hives, Itching      Medication List        Accurate as of 03/29/18 11:59 PM. Always use your most recent med list.          ADVAIR DISKUS 100-50 MCG/DOSE Aepb Generic drug:   Fluticasone-Salmeterol Inhale 1 puff into the lungs 2 (two) times daily as needed (shortness for breath).   amLODipine 5 MG tablet Commonly known as:  NORVASC Take 5 mg by mouth daily.   aspirin EC 81 MG tablet Take 81 mg by mouth daily.   ciprofloxacin 500 MG tablet Commonly known as:  CIPRO Take by mouth.   docusate sodium 100 MG capsule Commonly known as:  COLACE Take 100 mg by mouth 3 (three) times daily.   gabapentin 300 MG capsule Commonly known as:  NEURONTIN Take 300-600 mg by mouth 2 (two) times daily. Takes 300 mg in the morning and 600 mg at bedtime   ibuprofen 600 MG tablet Commonly known as:  ADVIL,MOTRIN Take 1 tablet (600 mg total) by mouth every 6 (six) hours as needed.   oxyCODONE-acetaminophen 5-325 MG tablet Commonly known as:  PERCOCET/ROXICET Take 1-2 tablets by mouth every 6 (six) hours as needed.   polyethylene glycol packet Commonly known as:  MIRALAX / GLYCOLAX Take 17 g by mouth daily as needed for mild constipation.   propranolol 40 MG tablet Commonly known as:  INDERAL Take 40 mg by mouth 2 (two) times daily.   torsemide 10 MG tablet Commonly known as:  DEMADEX Take 10 mg by mouth daily.   traMADol 50 MG tablet Commonly known as:  ULTRAM Take 50 mg by mouth 3 (three) times daily. Also used for migraines       Allergies:  Allergies  Allergen Reactions  . Lisinopril Cough    Coughed so bad that she had to go to ER.    . Sulfa Antibiotics Hives and Itching    Family History: Family History  Problem Relation Age of Onset  . Leukemia Other 30       daughter of sister  . Prostate cancer Maternal Grandfather        deceased 86  . Cancer Paternal Uncle        unk. type  . Cancer Paternal Aunt        unk. type  . Prostate cancer Maternal Uncle   . Prostate cancer Maternal Uncle   . Breast cancer Neg Hx   . Kidney cancer Neg Hx   . Kidney disease Neg Hx     Social History:  reports that she has been smoking cigarettes.  She  has a 20.00 pack-year smoking history. She has never used smokeless tobacco. She reports that she does not drink alcohol. Her drug history is not on file.  ROS: UROLOGY Frequent Urination?: No Hard to postpone urination?: No Burning/pain with urination?: No Get up at night to urinate?: No Leakage of urine?: No Urine stream starts and stops?: No Trouble starting  stream?: No Do you have to strain to urinate?: No Blood in urine?: No Urinary tract infection?: No Sexually transmitted disease?: No Injury to kidneys or bladder?: No Painful intercourse?: No Weak stream?: No Currently pregnant?: No Vaginal bleeding?: No Last menstrual period?: n  Gastrointestinal Nausea?: No Vomiting?: No Indigestion/heartburn?: No Diarrhea?: No  Constitutional Fever: No Night sweats?: No Weight loss?: No Fatigue?: No  Skin Skin rash/lesions?: No Itching?: No  Eyes Blurred vision?: No Double vision?: No  Ears/Nose/Throat Sore throat?: No Sinus problems?: No  Hematologic/Lymphatic Swollen glands?: No Easy bruising?: No  Cardiovascular Leg swelling?: No Chest pain?: No  Respiratory Cough?: No Shortness of breath?: No  Endocrine Excessive thirst?: No  Musculoskeletal Back pain?: Yes Joint pain?: No  Neurological Headaches?: No Dizziness?: No  Psychologic Depression?: No Anxiety?: No  Physical Exam: BP (!) 152/86   Pulse 84   Ht 5\' 3"  (1.6 m)   Wt 182 lb (82.6 kg)   BMI 32.24 kg/m   Constitutional:  Alert and oriented, No acute distress. HEENT: [place] AT, moist mucus membranes.  Trachea midline, no masses. Cardiovascular: No clubbing, cyanosis, or edema. Respiratory: Normal respiratory effort, no increased work of breathing. Skin: No rashes, bruises or suspicious lesions. Neurologic: Grossly intact, no focal deficits, moving all 4 extremities. Psychiatric: Normal mood and affect.  Laboratory Data: Lab Results  Component Value Date   WBC 5.5 01/01/2018   HGB  15.3 (H) 01/03/2018   HCT 45.0 01/03/2018   MCV 89.9 01/01/2018   PLT 268 01/01/2018    Lab Results  Component Value Date   CREATININE 0.99 01/01/2018    Lab Results  Component Value Date   TESTOSTERONE 19 02/07/2018     Urinalysis Results for orders placed or performed in visit on 03/29/18  CULTURE, URINE COMPREHENSIVE  Result Value Ref Range   Urine Culture, Comprehensive Final report    Organism ID, Bacteria Comment     Pertinent Imaging: KUB ordered today at time of visit to assess for stone stability  Previous scan from 12/14/2017 was reviewed personally today  Assessment & Plan:    1. Left nephrolithiasis Large left lower pole 16 x 16 x 14 renal calculus, KUB ordered today to assess stability  We discussed options for management of the stone.  I recommended ureteroscopy today primarily due to the presence of mild urothelial enhancement and thickening in order to assess endoscopically to ensure this is related to inflammation as suspected.  Alternatives include shockwave lithotripsy versus PCNL although views are less favored.  We discussed the risk of ureteroscopy today extensively including risk of bleeding, infection, damage to surrounding structures, need for multiple staged procedure, and risk of ureteral stent.  There is also risk of ureteral injury, perforation, or scar tissue formation.  She understands all of this.  We also discussed today it is unclear whether her pain is related to the stone given its nonobstructing nature.  She had been for many years which is chronic and did not resolve after resection of her pelvic mass.  Finally, she expresses today that she is frustrated and not sure if she like to have surgery with me here in [place].  She would like to perhaps seek a second opinion.  I explained that I am happy to treat her stone here in [place] and then have to try to expedite her care.  I also spent quite some time clarifying that in fact she  does not have an indwelling ureteral stent as this is not been  seen on any imaging or cystoscopically.  All questions were answered today.  She will let us know how she like to proceed.  A booking sheet was filled out today in case was discussed my surgical scheduler, Amy who returns to the patient a few days if she does not hear back from her.  - CULTURE, URINE COMPREHENSIVE - DG Abd 1 View; Future  2. Left flank pain As above, unclear if flank pain related to stone given his nonobstructing nature  3. Urothelial lesion As above, recommend endoscopic evaluation, suspected related to chronic inflammation from the stone   Hollice Espy, MD  [place] [address] [place], [place] [address] [phone]  I spent 25 min with this patient of which greater than 50% was spent in counseling and coordination of care with the patient.

## 2018-03-30 ENCOUNTER — Ambulatory Visit
Admission: RE | Admit: 2018-03-30 | Discharge: 2018-03-30 | Disposition: A | Discharge status: Home / Self Care | Payer: 59 | Source: Ambulatory Visit | Attending: Urology | Admitting: Urology

## 2018-03-30 ENCOUNTER — Telehealth: Payer: Self-pay | Admitting: Radiology

## 2018-03-30 DIAGNOSIS — N200 Calculus of kidney: Secondary | ICD-10-CM | POA: Insufficient documentation

## 2018-03-30 NOTE — Telephone Encounter (Signed)
LMOM to return call. Need to discuss whether pt would like to proceed with left ureteroscopy, laser lithotripsy & left ureteral stent placement per Dr Erlene Quan.

## 2018-04-01 LAB — CULTURE, URINE COMPREHENSIVE

## 2018-04-02 NOTE — Telephone Encounter (Signed)
Called pt to discuss treatment of left renal calculus. Pt states she is getting a second opinion at [place] will call back if she wants to schedule surgery here.

## 2018-04-10 ENCOUNTER — Encounter: Payer: Self-pay | Admitting: *Deleted

## 2018-04-11 ENCOUNTER — Ambulatory Visit: Payer: Commercial Managed Care - HMO | Admitting: Certified Registered Nurse Anesthetist

## 2018-04-11 ENCOUNTER — Encounter: Payer: Self-pay | Admitting: Oncology

## 2018-04-11 ENCOUNTER — Encounter
Admission: RE | Disposition: A | Discharge status: Home / Self Care | Payer: Self-pay | Source: Ambulatory Visit | Attending: Unknown Physician Specialty

## 2018-04-11 ENCOUNTER — Ambulatory Visit
Admission: RE | Admit: 2018-04-11 | Discharge: 2018-04-11 | Disposition: A | Discharge status: Home / Self Care | Payer: Commercial Managed Care - HMO | Source: Ambulatory Visit | Attending: Unknown Physician Specialty | Admitting: Unknown Physician Specialty

## 2018-04-11 ENCOUNTER — Encounter: Payer: Self-pay | Admitting: Certified Registered Nurse Anesthetist

## 2018-04-11 ENCOUNTER — Other Ambulatory Visit: Payer: Self-pay

## 2018-04-11 DIAGNOSIS — K3189 Other diseases of stomach and duodenum: Secondary | ICD-10-CM | POA: Insufficient documentation

## 2018-04-11 DIAGNOSIS — Z7982 Long term (current) use of aspirin: Secondary | ICD-10-CM | POA: Insufficient documentation

## 2018-04-11 DIAGNOSIS — I10 Essential (primary) hypertension: Secondary | ICD-10-CM | POA: Insufficient documentation

## 2018-04-11 DIAGNOSIS — M1990 Unspecified osteoarthritis, unspecified site: Secondary | ICD-10-CM | POA: Diagnosis not present

## 2018-04-11 DIAGNOSIS — Z79899 Other long term (current) drug therapy: Secondary | ICD-10-CM | POA: Insufficient documentation

## 2018-04-11 DIAGNOSIS — K640 First degree hemorrhoids: Secondary | ICD-10-CM | POA: Insufficient documentation

## 2018-04-11 DIAGNOSIS — K219 Gastro-esophageal reflux disease without esophagitis: Secondary | ICD-10-CM | POA: Insufficient documentation

## 2018-04-11 DIAGNOSIS — J45909 Unspecified asthma, uncomplicated: Secondary | ICD-10-CM | POA: Insufficient documentation

## 2018-04-11 DIAGNOSIS — K2950 Unspecified chronic gastritis without bleeding: Secondary | ICD-10-CM | POA: Insufficient documentation

## 2018-04-11 DIAGNOSIS — R1084 Generalized abdominal pain: Secondary | ICD-10-CM | POA: Diagnosis present

## 2018-04-11 DIAGNOSIS — K209 Esophagitis, unspecified: Secondary | ICD-10-CM | POA: Insufficient documentation

## 2018-04-11 DIAGNOSIS — Z888 Allergy status to other drugs, medicaments and biological substances status: Secondary | ICD-10-CM | POA: Diagnosis not present

## 2018-04-11 DIAGNOSIS — F329 Major depressive disorder, single episode, unspecified: Secondary | ICD-10-CM | POA: Diagnosis not present

## 2018-04-11 DIAGNOSIS — K621 Rectal polyp: Secondary | ICD-10-CM | POA: Insufficient documentation

## 2018-04-11 DIAGNOSIS — Z882 Allergy status to sulfonamides status: Secondary | ICD-10-CM | POA: Diagnosis not present

## 2018-04-11 HISTORY — DX: Edema, unspecified: R60.9

## 2018-04-11 HISTORY — DX: Pneumonia, unspecified organism: J18.9

## 2018-04-11 HISTORY — PX: COLONOSCOPY WITH PROPOFOL: SHX5780

## 2018-04-11 HISTORY — PX: ESOPHAGOGASTRODUODENOSCOPY (EGD) WITH PROPOFOL: SHX5813

## 2018-04-11 SURGERY — ESOPHAGOGASTRODUODENOSCOPY (EGD) WITH PROPOFOL
Anesthesia: General

## 2018-04-11 MED ORDER — SODIUM CHLORIDE 0.9 % IV SOLN: INTRAVENOUS | Status: DC

## 2018-04-11 MED ORDER — LIDOCAINE HCL (PF) 2 % IJ SOLN
INTRAMUSCULAR | Status: AC
Start: 2018-04-11 — End: ?
  Filled 2018-04-11: qty 10

## 2018-04-11 MED ORDER — PROPOFOL 500 MG/50ML IV EMUL
INTRAVENOUS | Status: DC | PRN
  Administered 2018-04-11: 130 ug/kg/min via INTRAVENOUS

## 2018-04-11 MED ORDER — SODIUM CHLORIDE 0.9 % IV SOLN
INTRAVENOUS | Status: DC
  Administered 2018-04-11: 08:00:00 via INTRAVENOUS

## 2018-04-11 MED ORDER — LIDOCAINE HCL (CARDIAC) PF 100 MG/5ML IV SOSY
PREFILLED_SYRINGE | INTRAVENOUS | Status: DC | PRN
  Administered 2018-04-11: 50 mg via INTRAVENOUS

## 2018-04-11 MED ORDER — PROPOFOL 500 MG/50ML IV EMUL
INTRAVENOUS | Status: AC
  Filled 2018-04-11: qty 50

## 2018-04-11 MED ORDER — PROPOFOL 10 MG/ML IV BOLUS
INTRAVENOUS | Status: DC | PRN
  Administered 2018-04-11: 24 mg via INTRAVENOUS
  Administered 2018-04-11: 100 mg via INTRAVENOUS

## 2018-04-11 NOTE — Transfer of Care (Signed)
Immediate Anesthesia Transfer of Care Note  Patient: Sandra Carrillo  Procedure(s) Performed: ESOPHAGOGASTRODUODENOSCOPY (EGD) WITH PROPOFOL (N/A ) COLONOSCOPY WITH PROPOFOL (N/A )  Patient Location: PACU and Endoscopy Unit  Anesthesia Type:General  Level of Consciousness: drowsy  Airway & Oxygen Therapy: Patient Spontanous Breathing and Patient connected to nasal cannula oxygen  Post-op Assessment: Report given to RN and Post -op Vital signs reviewed and stable  Post vital signs: Reviewed and stable  Last Vitals:  Vitals Value Taken Time  BP 81/52 04/11/2018  9:05 AM  Temp 36.1 C 04/11/2018  9:05 AM  Pulse 71 04/11/2018  9:05 AM  Resp 16 04/11/2018  9:05 AM  SpO2 99 % 04/11/2018  9:05 AM  Vitals shown include unvalidated device data.  Last Pain:  Vitals:   04/11/18 0748  PainSc: 7          Complications: No apparent anesthesia complications

## 2018-04-11 NOTE — Anesthesia Post-op Follow-up Note (Signed)
Anesthesia QCDR form completed.        

## 2018-04-11 NOTE — Op Note (Signed)
[facility] Gastroenterology Patient Name: [place] Procedure Date: 04/11/2018 8:21 AM MRN: [id number] Account #: [id number] Date of Birth: August 13, 1961 Admit Type: Outpatient Age: 57 Room: [facility] ENDO ROOM 3 Gender: Female Note Status: Finalized Procedure:            Upper GI endoscopy Indications:          Epigastric abdominal pain Providers:            Manya Silvas, MD Referring MD:         Ocie Cornfield. Ouida Sills MD, MD (Referring MD) Medicines:            Propofol per Anesthesia Complications:        No immediate complications. Procedure:            Pre-Anesthesia Assessment:                       - After reviewing the risks and benefits, the patient                        was deemed in satisfactory condition to undergo the                        procedure.                       After obtaining informed consent, the endoscope was                        passed under direct vision. Throughout the procedure,                        the patient's blood pressure, pulse, and oxygen                        saturations were monitored continuously. The Endoscope                        was introduced through the mouth, and advanced to the                        second part of duodenum. The upper GI endoscopy was                        accomplished without difficulty. The upper GI endoscopy                        was accomplished without difficulty. The patient                        tolerated the procedure well. Findings:      The examined esophagus was normal. GEJ 39-40cm.      Patchy mild inflammation characterized by erythema and granularity was       found at the gastroesophageal junction, in the gastric body and in the       gastric antrum. Biopsies were taken with a cold forceps for histology.       Biopsies were taken with a cold forceps for Helicobacter pylori testing.      The examined duodenum was normal. Impression:           - Normal esophagus.                -  Acute gastritis. Biopsied.                       - Normal examined duodenum. Recommendation:       - Await pathology results. Manya Silvas, MD 04/11/2018 8:45:14 AM This report has been signed electronically. Number of Addenda: 0 Note Initiated On: 04/11/2018 8:21 AM      [facility]

## 2018-04-11 NOTE — Op Note (Signed)
[facility] Gastroenterology Patient Name: [place] Procedure Date: 04/11/2018 8:20 AM MRN: [id number] Account #: [id number] Date of Birth: 07-29-1961 Admit Type: Outpatient Age: 57 Room: [facility] ENDO ROOM 3 Gender: Female Note Status: Finalized Procedure:            Colonoscopy Indications:          Generalized abdominal pain Providers:            Manya Silvas, MD Referring MD:         Ocie Cornfield. Ouida Sills MD, MD (Referring MD) Medicines:            Propofol per Anesthesia Complications:        No immediate complications. Procedure:            Pre-Anesthesia Assessment:                       - After reviewing the risks and benefits, the patient                        was deemed in satisfactory condition to undergo the                        procedure.                       After obtaining informed consent, the colonoscope was                        passed under direct vision. Throughout the procedure,                        the patient's blood pressure, pulse, and oxygen                        saturations were monitored continuously. The                        Colonoscope was introduced through the anus and                        advanced to the the cecum, identified by appendiceal                        orifice and ileocecal valve. The colonoscopy was                        performed without difficulty. The patient tolerated the                        procedure well. The quality of the bowel preparation                        was excellent. Findings:      Four sessile polyps were found in the rectum. The polyps were diminutive       in size. These polyps were removed with a jumbo cold forceps. Resection       and retrieval were complete.      Internal hemorrhoids were found during endoscopy. The hemorrhoids were       small and Grade I (internal hemorrhoids that do not prolapse).      The exam  was otherwise without abnormality. Impression:           -  Four diminutive polyps in the rectum, removed with a                        jumbo cold forceps. Resected and retrieved.                       - Internal hemorrhoids.                       - The examination was otherwise normal. Recommendation:       - Await pathology results. Manya Silvas, MD 04/11/2018 9:02:56 AM This report has been signed electronically. Number of Addenda: 0 Note Initiated On: 04/11/2018 8:20 AM Scope Withdrawal Time: 0 hours 9 minutes 30 seconds  Total Procedure Duration: 0 hours 12 minutes 42 seconds       [facility]

## 2018-04-11 NOTE — H&P (Signed)
Primary Care Physician:  Kirk Ruths, MD Primary Gastroenterologist:  Dr. Vira Agar  Pre-Procedure History & Physical: HPI:  Sandra Carrillo is a 57 y.o. female is here for an endoscopy and colonoscopy. Done for abdominal pain.   Past Medical History:  Diagnosis Date  . Arthritis   . Asthma   . Degenerative disc disease, lumbar   . Depression   . Edema   . History of kidney stones 2000   surgery in the past  . Hypertension   . Migraine   . Pneumonia     Past Surgical History:  Procedure Laterality Date  .  carpal tunnel surgery    . ABDOMINAL HYSTERECTOMY     partial 27 years ago  . ingrown toenail    . LITHOTRIPSY    . removal of fibroid     before hyserectomy  . ROBOTIC ASSISTED BILATERAL SALPINGO OOPHERECTOMY Bilateral 01/03/2018   Procedure: ROBOTIC ASSISTED BILATERAL SALPINGO OOPHORECTOMY;  Surgeon: Gillis Ends, MD;  Location: [facility] ORS;  Service: Gynecology;  Laterality: Bilateral;  . TUBAL LIGATION  1998    Prior to Admission medications   Medication Sig Start Date End Date Taking? Authorizing Provider  amLODipine (NORVASC) 5 MG tablet Take 5 mg by mouth daily. 11/24/17  Yes [provider]  aspirin EC 81 MG tablet Take 81 mg by mouth daily.   Yes [provider]  diclofenac sodium (VOLTAREN) 1 % GEL Apply 1 application topically 4 (four) times daily.   Yes [provider]  dicyclomine (BENTYL) 10 MG capsule Take 10 mg by mouth.   Yes [provider]  Fluticasone-Salmeterol (ADVAIR DISKUS) 100-50 MCG/DOSE AEPB Inhale 1 puff into the lungs 2 (two) times daily as needed (shortness for breath).  06/08/15  Yes [provider]  gabapentin (NEURONTIN) 300 MG capsule Take 300-600 mg by mouth 2 (two) times daily. Takes 300 mg in the morning and 600 mg at bedtime   Yes [provider]  ibuprofen (ADVIL,MOTRIN) 600 MG tablet Take 1 tablet (600 mg total) by mouth every 6 (six) hours as needed. 01/03/18  Yes  Malachy Mood, MD  propranolol (INDERAL) 40 MG tablet Take 40 mg by mouth 2 (two) times daily. 10/10/17 10/10/18 Yes [provider]  torsemide (DEMADEX) 10 MG tablet Take 10 mg by mouth daily. 12/18/17  Yes [provider]  traMADol (ULTRAM) 50 MG tablet Take 50 mg by mouth 3 (three) times daily. Also used for migraines 12/05/17  Yes [provider]  docusate sodium (COLACE) 100 MG capsule Take 100 mg by mouth 3 (three) times daily.     [provider]  oxyCODONE-acetaminophen (PERCOCET/ROXICET) 5-325 MG tablet Take 1-2 tablets by mouth every 6 (six) hours as needed. Patient not taking: Reported on 04/11/2018 01/09/18   Malachy Mood, MD  polyethylene glycol [facility] / Floria Raveling) packet Take 17 g by mouth daily as needed for mild constipation.    [provider]    Allergies as of 03/14/2018 - Review Complete 02/07/2018  Allergen Reaction Noted  . Lisinopril Cough 12/14/2017  . Sulfa antibiotics Hives and Itching 12/21/2017    Family History  Problem Relation Age of Onset  . Leukemia Other 39       daughter of sister  . Prostate cancer Maternal Grandfather        deceased 37  . Cancer Paternal Uncle        unk. type  . Cancer Paternal Aunt  unk. type  . Prostate cancer Maternal Uncle   . Prostate cancer Maternal Uncle   . Breast cancer Neg Hx   . Kidney cancer Neg Hx   . Kidney disease Neg Hx     Social History   Socioeconomic History  . Marital status: Married    Spouse name: tony  . Number of children: 4  . Years of education: Not on file  . Highest education level: Not on file  Occupational History  . Not on file  Social Needs  . Financial resource strain: Not on file  . Food insecurity:    Worry: Not on file    Inability: Not on file  . Transportation needs:    Medical: Not on file    Non-medical: Not on file  Tobacco Use  . Smoking status: Current Every Day Smoker    Packs/day: 0.50    Years: 20.00     Pack years: 10.00    Types: Cigarettes  . Smokeless tobacco: Never Used  Substance and Sexual Activity  . Alcohol use: No    Frequency: Never  . Drug use: Not Currently  . Sexual activity: Yes  Lifestyle  . Physical activity:    Days per week: Not on file    Minutes per session: Not on file  . Stress: Not on file  Relationships  . Social connections:    Talks on phone: Not on file    Gets together: Not on file    Attends religious service: Not on file    Active member of club or organization: Not on file    Attends meetings of clubs or organizations: Not on file    Relationship status: Not on file  . Intimate partner violence:    Fear of current or ex partner: Not on file    Emotionally abused: Not on file    Physically abused: Not on file    Forced sexual activity: Not on file  Other Topics Concern  . Not on file  Social History Narrative  . Not on file    Review of Systems: See HPI, otherwise negative ROS  Physical Exam: BP 131/88   Pulse 74   Temp (!) 96.2 F (35.7 C)   Resp 18   Ht 5\' 3"  (1.6 m)   Wt 82.6 kg (182 lb)   SpO2 100%   BMI 32.24 kg/m  General:   Alert,  pleasant and cooperative in NAD Head:  Normocephalic and atraumatic. Neck:  Supple; no masses or thyromegaly. Lungs:  Clear throughout to auscultation.    Heart:  Regular rate and rhythm. Abdomen:  Soft, nontender and nondistended. Normal bowel sounds, without guarding, and without rebound.   Neurologic:  Alert and  oriented x4;  grossly normal neurologically.  Impression/Plan: [name] is here for an endoscopy and colonoscopy to be performed for abdominal pain.  Risks, benefits, limitations, and alternatives regarding  endoscopy and colonoscopy have been reviewed with the patient.  Questions have been answered.  All parties agreeable.   Gaylyn Cheers, MD  04/11/2018, 8:23 AM

## 2018-04-11 NOTE — Anesthesia Postprocedure Evaluation (Signed)
Anesthesia Post Note  Patient: [place]  Procedure(s) Performed: ESOPHAGOGASTRODUODENOSCOPY (EGD) WITH PROPOFOL (N/A ) COLONOSCOPY WITH PROPOFOL (N/A )  Patient location during evaluation: Endoscopy Anesthesia Type: General Level of consciousness: awake and alert Pain management: pain level controlled Vital Signs Assessment: post-procedure vital signs reviewed and stable Respiratory status: spontaneous breathing, nonlabored ventilation, respiratory function stable and patient connected to nasal cannula oxygen Cardiovascular status: blood pressure returned to baseline and stable Postop Assessment: no apparent nausea or vomiting Anesthetic complications: no     Last Vitals:  Vitals:   04/11/18 0925 04/11/18 0950  BP: 115/71 128/82  Pulse: 67   Resp: 15 16  Temp:    SpO2: 100% 100%    Last Pain:  Vitals:   04/11/18 1001  PainSc: 0-No pain                 Martha Clan

## 2018-04-11 NOTE — Anesthesia Preprocedure Evaluation (Addendum)
Anesthesia Evaluation  Patient identified by MRN, date of birth, ID band Patient awake    Reviewed: Allergy & Precautions, H&P , NPO status , reviewed documented beta blocker date and time   History of Anesthesia Complications Negative for: history of anesthetic complications  Airway Mallampati: II  TM Distance: <3 FB     Dental  (+) Teeth Intact, Dental Advidsory Given   Pulmonary neg shortness of breath, asthma , neg sleep apnea, neg COPD, neg recent URI, Current Smoker,           Cardiovascular Exercise Tolerance: Good hypertension, (-) angina(-) CAD, (-) Past MI, (-) Cardiac Stents and (-) CABG (-) dysrhythmias (-) Valvular Problems/Murmurs     Neuro/Psych PSYCHIATRIC DISORDERS Depression negative neurological ROS     GI/Hepatic Neg liver ROS, GERD  ,  Endo/Other  negative endocrine ROS  Renal/GU Renal disease     Musculoskeletal   Abdominal   Peds  Hematology negative hematology ROS (+)   Anesthesia Other Findings Past Medical History: No date: Arthritis No date: Asthma No date: Degenerative disc disease, lumbar No date: Depression No date: Edema 2000: History of kidney stones     Comment:  surgery in the past No date: Hypertension No date: Migraine No date: Pneumonia   Reproductive/Obstetrics negative OB ROS                            Anesthesia Physical  Anesthesia Plan  ASA: II  Anesthesia Plan: General   Post-op Pain Management:    Induction: Intravenous  PONV Risk Score and Plan: 3 and Propofol infusion  Airway Management Planned: Nasal Cannula  Additional Equipment:   Intra-op Plan:   Post-operative Plan:   Informed Consent: I have reviewed the patients History and Physical, chart, labs and discussed the procedure including the risks, benefits and alternatives for the proposed anesthesia with the patient or authorized representative who has indicated  his/her understanding and acceptance.   Dental Advisory Given  Plan Discussed with: CRNA  Anesthesia Plan Comments:         Anesthesia Quick Evaluation

## 2018-04-12 LAB — SURGICAL PATHOLOGY

## 2018-04-13 ENCOUNTER — Encounter: Payer: Self-pay | Admitting: Unknown Physician Specialty

## 2018-06-05 ENCOUNTER — Encounter: Payer: Self-pay | Admitting: Oncology

## 2018-08-08 ENCOUNTER — Inpatient Hospital Stay: Payer: 59

## 2018-08-08 ENCOUNTER — Inpatient Hospital Stay: Payer: 59 | Attending: Obstetrics and Gynecology | Admitting: Obstetrics and Gynecology

## 2018-08-08 VITALS — BP 149/98 | HR 67 | Temp 97.6°F | Resp 18 | Ht 63.0 in | Wt 182.2 lb

## 2018-08-08 DIAGNOSIS — Z90710 Acquired absence of both cervix and uterus: Secondary | ICD-10-CM | POA: Diagnosis not present

## 2018-08-08 DIAGNOSIS — D3912 Neoplasm of uncertain behavior of left ovary: Secondary | ICD-10-CM | POA: Diagnosis present

## 2018-08-08 DIAGNOSIS — F17210 Nicotine dependence, cigarettes, uncomplicated: Secondary | ICD-10-CM

## 2018-08-08 DIAGNOSIS — Z90722 Acquired absence of ovaries, bilateral: Secondary | ICD-10-CM

## 2018-08-08 DIAGNOSIS — D489 Neoplasm of uncertain behavior, unspecified: Secondary | ICD-10-CM

## 2018-08-08 NOTE — Progress Notes (Signed)
Gynecologic Oncology Interval Visit   Referring Provider: Dr. 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 P2 female diagnosed with steroid (cell) tumor, not otherwise specified, s/p robotic BSO, who returns to clinic for surveillance.   She was previously last seen in clinic by Dr. Theora Gianotti on 02/07/18 for postoperative evaluation and discussion of pathology results. She was referred to genetic counselor for evaluation for germline DICER1 mutations.   03/04/2018- [facility] Multi-Cancer Panel identified: Variants of Uncertain Significance identified in POLD1, RECQL4, and WRN. No DICER1 mutations identified.   Based on pathology and [facility] review she appears to have a steroid (cell) tumor, not otherwise specified. The tumor was noted to have an elevated mitotic rate and at least focal tumor necrosis in the form of apoptosis, features which have been associated with more aggressive behavior in these tumors. Small tumor size was reassuring.    AFP- 4.9 E2- 9.0 Inhibin B- < 7.0 Testosterone- 19 DHEA- 38.6  Today, she reports fatigue, weakness, decreased appetite, constipation, back pain, numbness with tingling, seasonal allergies, and depressed mood. She denies gyn complaints.   Gynecology-Oncology History:  Initially, patient was initially seen in consultation from Dr. Wynetta Emery at [place] for painful pelvic mass. She had had abdominal pain for approximately 4 months and was treated with Cipro and Flagyl for possible diverticulitis. She saw improvement in symptoms but no resolution. Urinalysis showed UTI. She had intermittent nausea w/o vomiting. Occassional alternating diarrhea and constipation as well as back pain. Some trouble emptying bladder. No fevers or chills. Reports recent labs were normal. in consultation from Dr. Wynetta Emery for painful pelvic mass.  CA125 = 7.8, CA19-9 = 32, HE4 - 94.1  CT  scan 12/14/17 IMPRESSION: -Large LEFT renal calculus 16 x 16 x 14 mm with mild enhancement of the walls of a slightly prominent LEFT renal pelvis question related to urinary tract infection; recommend correlation with urinalysis. -Complicated intermediate attenuation mass in the pelvis 8.0 x 6.0 x 5.6 cm in size, likely of LEFT ovarian origin, question ovarian neoplasm; further assessment by transabdominal and transvaginal sonography of the pelvis recommended to exclude neoplasm. Suspected LEFT hydrosalpinx. -Indeterminate 1.8 x 1.5 cm splenic lesion; followup characterization by MR recommended. -Omitted from the initial dictation is presence of a calcification and questionable area of wall thickening at the bladder base; on sagittal views, is of uncertain whether this represents indentation of the bladder by adjacent soft tissue or a small mass 19 x 9 x 12 mm at the inferior bladder wall. Followup cystoscopic evaluation recommended to exclude tumor at bladder base.  She had lithotripsy and surgery about 15+ years ago at [facility] for kidney stones.  No problems since then. Prior hysterectomy.   She saw Dr. Erlene Quan with Urology and undergone cystoscopy. < 1 cm bilobed submucosal mass situated at the posterior bladder neck was found. 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 Surgical findings included mild pelvic adhesive disease. There was no intra-abdominal evidence of metastatic disease.   Pathology: DIAGNOSIS:  A. 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  - INCIDENTAL SEX CORD STROMAL TUMOR, 1.4 CM, SEE COMMENT.   FALLOPIAN TUBE, LEFT; SALPINGECTOMY:  - ADHESIONS AND MILD HYDROSALPINX.   B.  OVARY AND FALLOPIAN TUBE; SALPINGO-OOPHORECTOMY:  - NO PATHOLOGIC CHANGES.   Pelvic washings  negative  Subsequently she had a kidney stone removed at [facility] and recovered well from that procedure.    Problem List: Patient Active Problem List   Diagnosis Date Noted  . 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 Pelvic mass in female     Past Medical History: Past Medical History:  Diagnosis Date  . Arthritis   . Asthma   . Degenerative disc disease, lumbar   . Depression   . Edema   . History of kidney stones 2000   surgery in the past  . Hypertension   . Migraine   . Pneumonia     Past Surgical History: Past Surgical History:  Procedure Laterality Date  .  carpal tunnel surgery    . ABDOMINAL HYSTERECTOMY     partial 27 years ago  . COLONOSCOPY WITH PROPOFOL N/A 04/11/2018   Procedure: COLONOSCOPY WITH PROPOFOL;  Surgeon: Manya Silvas, MD;  Location: [facility] ENDOSCOPY;  Service: Endoscopy;  Laterality: N/A;  . ESOPHAGOGASTRODUODENOSCOPY (EGD) WITH PROPOFOL N/A 04/11/2018   Procedure: ESOPHAGOGASTRODUODENOSCOPY (EGD) WITH PROPOFOL;  Surgeon: Manya Silvas, MD;  Location[facility] ENDOSCOPY;  Service: Endoscopy;  Laterality: N/A;  . ingrown toenail    . LITHOTRIPSY    . removal of fibroid     before hyserectomy  . ROBOTIC ASSISTED BILATERAL SALPINGO OOPHERECTOMY Bilateral 01/03/2018   Procedure: ROBOTIC ASSISTED BILATERAL SALPINGO OOPHORECTOMY;  Surgeon: Gillis Ends, MD;  Location: [facility] ORS;  Service: Gynecology;  Laterality: Bilateral;  . TUBAL LIGATION  1998    OB History:  OB History  No data available    Family History: No family history of breast, kidney or prostate cancer per patient on 02/07/2018 Family History  Problem Relation Age of Onset  . Leukemia Other 91       daughter of sister  . Prostate cancer Maternal Grandfather        deceased 89  . Cancer Paternal Uncle        unk. type  . Cancer Paternal Aunt        unk. type  . Prostate cancer Maternal Uncle   . Prostate cancer Maternal Uncle   . Breast cancer Neg Hx   . Kidney cancer  Neg Hx   . Kidney disease Neg Hx     Social History: Social History   Socioeconomic History  . Marital status: Married    Spouse name: tony  . Number of children: 4  . Years of education: Not on file  . Highest education level: Not on file  Occupational History  . Not on file  Social Needs  . Financial resource strain: Not on file  . Food insecurity:    Worry: Not on file    Inability: Not on file  . Transportation needs:    Medical: Not on file    Non-medical: Not on file  Tobacco Use  . Smoking status: Current Every Day Smoker    Packs/day: 0.50    Years: 20.00    Pack years: 10.00    Types: Cigarettes  . Smokeless tobacco: Never Used  Substance and Sexual Activity  . Alcohol use: No    Frequency: Never  . Drug use: Not Currently  . Sexual activity: Yes  Lifestyle  . Physical activity:    Days per week: Not on file    Minutes per session: Not on file  . Stress: Not on file  Relationships  .  Social connections:    Talks on phone: Not on file    Gets together: Not on file    Attends religious service: Not on file    Active member of club or organization: Not on file    Attends meetings of clubs or organizations: Not on file    Relationship status: Not on file  . Intimate partner violence:    Fear of current or ex partner: Not on file    Emotionally abused: Not on file    Physically abused: Not on file    Forced sexual activity: Not on file  Other Topics Concern  . Not on file  Social History Narrative  . Not on file    Allergies: Allergies  Allergen Reactions  . Ace Inhibitors   . Lasix [Furosemide]   . Lisinopril Cough    Coughed so bad that she had to go to ER.    . Sulfa Antibiotics Hives and Itching    Current Medications: Current Outpatient Medications  Medication Sig Dispense Refill  . amLODipine (NORVASC) 5 MG tablet Take 5 mg by mouth daily.  5  . aspirin EC 81 MG tablet Take 81 mg by mouth daily.    . diclofenac sodium (VOLTAREN) 1 %  GEL Apply 1 application topically 4 (four) times daily.    Marland Kitchen docusate sodium (COLACE) 100 MG capsule Take 100 mg by mouth 2 (two) times daily.     . Fluticasone-Salmeterol (ADVAIR DISKUS) 100-50 MCG/DOSE AEPB Inhale 1 puff into the lungs 2 (two) times daily as needed (shortness for breath).     . gabapentin (NEURONTIN) 300 MG capsule Take 300 mg by mouth 3 (three) times daily. Takes 300 mg in the morning and 600 mg at bedtime    . ibuprofen (ADVIL,MOTRIN) 600 MG tablet Take 1 tablet (600 mg total) by mouth every 6 (six) hours as needed. 60 tablet 3  . polyethylene glycol (MIRALAX / GLYCOLAX) packet Take 17 g by mouth daily as needed for mild constipation.    . propranolol (INDERAL) 40 MG tablet Take 40 mg by mouth 2 (two) times daily.    . SUMAtriptan (IMITREX) 100 MG tablet Take 100 mg by mouth as directed. May repeat in 2 hours if headache persists or recurs.    . topiramate (TOPAMAX) 50 MG tablet Take 50 mg by mouth 2 (two) times daily.    Marland Kitchen torsemide (DEMADEX) 10 MG tablet Take 10 mg by mouth daily.    . traMADol (ULTRAM) 50 MG tablet Take 50 mg by mouth daily. Also used for migraines  0  . dicyclomine (BENTYL) 10 MG capsule Take 10 mg by mouth.     No current facility-administered medications for this visit.    Review of Systems General:  Fatigue & weakness, decreased appetite Skin: no complaints Eyes: no complaints HEENT: no complaints Breasts: no complaints Pulmonary: no complaints Cardiac: no complaints Gastrointestinal: no complaints Genitourinary/Sexual: no complaints Ob/Gyn: no complaints Musculoskeletal: no complaints Hematology: no complaints Neurologic/Psych: no complaints  Objective:  Physical Examination:  BP (!) 149/98   Pulse 67   Temp 97.6 F (36.4 C) (Tympanic)   Resp 18   Ht 5\' 3"  (1.6 m)   Wt 182 lb 3.2 oz (82.6 kg)   BMI 32.28 kg/m    ECOG Performance Status: 1 - Symptomatic but completely ambulatory  GENERAL: Patient is a well appearing female in no  acute distress HEENT:  PERRL, neck supple with midline trachea. Thyroid without masses.  NODES:  No  cervical, supraclavicular, axillary, or inguinal lymphadenopathy palpated.  LUNGS:  Clear to auscultation bilaterally.  No wheezes or rhonchi. HEART:  Regular rate and rhythm. No murmur appreciated. ABDOMEN:  Soft, nontender.  Positive, normoactive bowel sounds.  MSK:  No focal spinal tenderness to palpation. Full range of motion bilaterally in the upper extremities. EXTREMITIES:  No peripheral edema.   SKIN:  Clear with no obvious rashes or skin changes. No nail dyscrasia. NEURO:  Nonfocal. Well oriented.  Appropriate affect.  Pelvic: EGBUS/Vagina: normal, Bimanual/RV: no masses     Assessment:  Sandra Carrillo is a 57 y.o. 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 Genetic testing negative for DICER1 mutation.  Chronic back pain with h/o DJD  Remote history prior hysterectomy.  Plan:   Problem List Items Addressed This Visit      Genitourinary   Sex cord stromal tumor - Primary   Relevant Orders   Inhibin B     Based on pathology and [facility] review she appears to have a steroid (cell) tumor, not otherwise specified. The tumor was noted to have an elevated mitotic rate and at least focal tumor necrosis in the form of apoptosis, features which have been associated with more aggressive behavior in these tumors. A reassuring feature, however, is the small 1.4 cm size of the tumor.    AFP, E2, inhibin B, testosterone, and DHEA were checked post op and were not evlevated. Will check inhibin B again today. Routine use of imaging studies is not recommended and will be ordered if recurrence is suspected based on symptoms and exam. She will RTC in 4 months.  Mellody Drown, MD  CC:  Referring Provider: Dr. Orlando Penner, MD

## 2018-08-08 NOTE — Progress Notes (Signed)
Pt having dryness especially wit sexual intercourse. She sometimes uses vaseline after.

## 2018-08-09 LAB — INHIBIN B: Inhibin B: <7 pg/mL (ref 0.0–16.9)

## 2018-08-15 ENCOUNTER — Telehealth: Payer: Self-pay

## 2018-08-15 NOTE — Telephone Encounter (Signed)
Voicemail left with [name]. Results of Inhibin B are back.  Results for Sandra, Carrillo (MRN [id number]) as of 08/15/2018 13:44  Ref. Range 08/08/2018 15:18  Inhibin B Latest Ref Range: 0.0 - 16.9 pg/mL <7.0     Oncology Nurse Navigator Documentation  Navigator Location: [facility] (08/15/18 1300)   )Navigator Encounter Type: Telephone (08/15/18 1300) Telephone: Outgoing Call;Diagnostic Results (08/15/18 1300)                                                  Time Spent with Patient: 15 (08/15/18 1300)

## 2018-08-22 ENCOUNTER — Telehealth: Payer: Self-pay

## 2018-08-22 NOTE — Telephone Encounter (Signed)
Inhibin B results mailed to home address. Oncology Nurse Navigator Documentation  Navigator Location: [facility] (08/22/18 1000)   )Navigator Encounter Type: Letter/Fax/Email;Diagnostic Results (08/22/18 1000)                                                    Time Spent with Patient: 15 (08/22/18 1000)

## 2018-08-22 NOTE — Telephone Encounter (Signed)
Sandra Carrillo returned call. Notified of Inhibin B results. Copy previously mailed to home address as well.  Results for [name], [name] (MRN [id number]) as of 08/22/2018 16:58  Ref. Range 08/08/2018 15:18  Inhibin B Latest Ref Range: 0.0 - 16.9 pg/mL <7.0    Oncology Nurse Navigator Documentation  Navigator Location: [facility] (08/22/18 1600)   )Navigator Encounter Type: Telephone;Diagnostic Results (08/22/18 1600)                                                    Time Spent with Patient: 15 (08/22/18 1600)

## 2018-12-12 ENCOUNTER — Inpatient Hospital Stay: Payer: Self-pay

## 2018-12-12 ENCOUNTER — Encounter (INDEPENDENT_AMBULATORY_CARE_PROVIDER_SITE_OTHER): Payer: Self-pay

## 2018-12-12 ENCOUNTER — Inpatient Hospital Stay: Payer: Self-pay | Attending: Obstetrics and Gynecology | Admitting: Obstetrics and Gynecology

## 2018-12-12 VITALS — BP 126/83 | HR 74 | Temp 98.1°F | Resp 18 | Ht 63.0 in | Wt 188.9 lb

## 2018-12-12 DIAGNOSIS — D3912 Neoplasm of uncertain behavior of left ovary: Secondary | ICD-10-CM | POA: Insufficient documentation

## 2018-12-12 DIAGNOSIS — Z90722 Acquired absence of ovaries, bilateral: Secondary | ICD-10-CM | POA: Insufficient documentation

## 2018-12-12 DIAGNOSIS — Z90710 Acquired absence of both cervix and uterus: Secondary | ICD-10-CM | POA: Insufficient documentation

## 2018-12-12 DIAGNOSIS — F17210 Nicotine dependence, cigarettes, uncomplicated: Secondary | ICD-10-CM | POA: Insufficient documentation

## 2018-12-12 DIAGNOSIS — D489 Neoplasm of uncertain behavior, unspecified: Secondary | ICD-10-CM

## 2018-12-12 NOTE — Patient Instructions (Signed)
I have sent referral to [facility] gyn-oncology for continued surveillance. If you develop concerning symptoms in interim, please contact our clinic to be evaluated. If you have any questions or concerns you are always welcome to call the clinic. Thank you for allowing [place] to participate in your care. Dr. Fransisca Connors & Beckey Rutter, NP

## 2018-12-12 NOTE — Progress Notes (Signed)
Pt states no gyn concerns, has crick in her neck

## 2018-12-12 NOTE — Progress Notes (Signed)
Gynecologic Oncology Interval Visit   Referring Provider: Dr. 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 P2 female diagnosed with steroid (cell) tumor, not otherwise specified, s/p robotic BSO, who returns to clinic for surveillance.   She was last seen by Dr. Fransisca Connors on 08/08/2018 for surveillance.   Inhibin B 08/08/2018 < 7.0  Today, she reports fatigue, constipation, problems with bladder function, ongoing back pain, numbness and tingling, seasonal allergies, and depressed mood. These are chronic and ongoing. She denies gyn complaints and in interim was seen at [facility] for evaluation of rectal tone. She has recently lost her insurance coverage and requests referral to [facility] for ongoing coverage as they are able to provide her with charity care.   Gynecology-Oncology History:  Initially, patient was initially seen in consultation from Dr. Wynetta Emery at [facility] for painful pelvic mass. She had had abdominal pain for approximately 4 months and was treated with Cipro and Flagyl for possible diverticulitis. She saw improvement in symptoms but no resolution. Urinalysis showed UTI. She had intermittent nausea w/o vomiting. Occassional alternating diarrhea and constipation as well as back pain. Some trouble emptying bladder. No fevers or chills. Reports recent labs were normal. in consultation from Dr. Wynetta Emery for painful pelvic mass.  CA125 = 7.8, CA19-9 = 32, HE4 - 94.1  CT scan 12/14/17 IMPRESSION: -Large LEFT renal calculus 16 x 16 x 14 mm with mild enhancement of the walls of a slightly prominent LEFT renal pelvis question related to urinary tract infection; recommend correlation with urinalysis. -Complicated intermediate attenuation mass in the pelvis 8.0 x 6.0 x 5.6 cm in size, likely of LEFT ovarian origin, question ovarian neoplasm; further assessment by transabdominal and transvaginal  sonography of the pelvis recommended to exclude neoplasm. Suspected LEFT hydrosalpinx. -Indeterminate 1.8 x 1.5 cm splenic lesion; followup characterization by MR recommended. -Omitted from the initial dictation is presence of a calcification and questionable area of wall thickening at the bladder base; on sagittal views, is of uncertain whether this represents indentation of the bladder by adjacent soft tissue or a small mass 19 x 9 x 12 mm at the inferior bladder wall. Followup cystoscopic evaluation recommended to exclude tumor at bladder base.  She had lithotripsy and surgery about 15+ years ago at [facility] for kidney stones.  No problems since then. Prior hysterectomy.   She saw Dr. Erlene Quan with Urology and undergone cystoscopy. < 1 cm bilobed submucosal mass situated at the posterior bladder neck was found. 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 Surgical findings included mild pelvic adhesive disease. There was no intra-abdominal evidence of metastatic disease.   Pathology: DIAGNOSIS:  A. 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  - INCIDENTAL SEX CORD STROMAL TUMOR, 1.4 CM, SEE COMMENT.   FALLOPIAN TUBE, LEFT; SALPINGECTOMY:  - ADHESIONS AND MILD HYDROSALPINX.   B. OVARY AND FALLOPIAN TUBE; SALPINGO-OOPHORECTOMY:  - NO PATHOLOGIC CHANGES.   Pelvic washings negative  Subsequently she had a kidney stone removed at [facility] and recovered well from that procedure.   Seen in clinic by Dr. Theora Gianotti on 02/07/18 for postoperative evaluation and discussion of pathology results. She was referred to genetic counselor for evaluation for germline DICER1 mutations.  03/04/2018- [facility] Multi-Cancer Panel identified: Variants of Uncertain Significance identified in POLD1, RECQL4, and WRN. No  DICER1 mutations identified.   Based on pathology and [facility] review she appears to have a steroid (cell) tumor, not otherwise specified. The tumor was noted to have an elevated mitotic rate and at least focal tumor necrosis in the form of apoptosis, features which have been associated with more aggressive behavior in these tumors. Small tumor size was reassuring.    AFP- 4.9 E2- 9.0 Inhibin B- < 7.0 Testosterone- 19 DHEA- 38.6  Problem List: Patient Active Problem List   Diagnosis Date Noted  . 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 Pelvic mass in female     Past Medical History: Past Medical History:  Diagnosis Date  . Arthritis   . Asthma   . Degenerative disc disease, lumbar   . Depression   . Edema   . History of kidney stones 2000   surgery in the past  . Hypertension   . Migraine   . 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 Pneumonia     Past Surgical History: Past Surgical History:  Procedure Laterality Date  .  carpal tunnel surgery    . ABDOMINAL HYSTERECTOMY     partial 27 years ago  . COLONOSCOPY WITH PROPOFOL N/A 04/11/2018   Procedure: COLONOSCOPY WITH PROPOFOL;  Surgeon: Manya Silvas, MD;  Location: [facility] ENDOSCOPY;  Service: Endoscopy;  Laterality: N/A;  . ESOPHAGOGASTRODUODENOSCOPY (EGD) WITH PROPOFOL N/A 04/11/2018   Procedure: ESOPHAGOGASTRODUODENOSCOPY (EGD) WITH PROPOFOL;  Surgeon: Manya Silvas, MD;  Location: [facility] ENDOSCOPY;  Service: Endoscopy;  Laterality: N/A;  . ingrown toenail    . kidney stone removal Left    at [facility]  . LITHOTRIPSY    . removal of fibroid     before hyserectomy  . ROBOTIC ASSISTED BILATERAL SALPINGO OOPHERECTOMY Bilateral 01/03/2018   Procedure: ROBOTIC ASSISTED BILATERAL SALPINGO OOPHORECTOMY;  Surgeon: Gillis Ends, MD;  Location: [facility] ORS;  Service: Gynecology;  Laterality: Bilateral;  . TUBAL LIGATION  1998    OB History:  OB History  No obstetric  history on file.    Family History: No family history of breast, kidney or prostate cancer per patient on 02/07/2018 Family History  Problem Relation Age of Onset  . Leukemia Other 22       daughter of sister  . Prostate cancer Maternal Grandfather        deceased 21  . Cancer Paternal Uncle        unk. type  . Cancer Paternal Aunt        unk. type  . Prostate cancer Maternal Uncle   . Prostate cancer Maternal Uncle   . Breast cancer Neg Hx   . Kidney cancer Neg Hx   . Kidney disease Neg Hx     Social History: Social History   Socioeconomic History  . Marital status: Married    Spouse name: tony  . Number of children: 4  . Years of education: Not on file  . Highest education level: Not on file  Occupational History  . Not on file  Social Needs  . Financial resource strain: Not on file  . Food insecurity:    Worry: Not on file    Inability: Not on file  . Transportation needs:    Medical: Not on file    Non-medical: Not on file  Tobacco Use  . Smoking  status: Current Every Day Smoker    Packs/day: 0.50    Years: 20.00    Pack years: 10.00    Types: Cigarettes  . Smokeless tobacco: Never Used  Substance and Sexual Activity  . Alcohol use: No    Frequency: Never  . Drug use: Not Currently  . Sexual activity: Yes  Lifestyle  . Physical activity:    Days per week: Not on file    Minutes per session: Not on file  . Stress: Not on file  Relationships  . Social connections:    Talks on phone: Not on file    Gets together: Not on file    Attends religious service: Not on file    Active member of club or organization: Not on file    Attends meetings of clubs or organizations: Not on file    Relationship status: Not on file  . Intimate partner violence:    Fear of current or ex partner: Not on file    Emotionally abused: Not on file    Physically abused: Not on file    Forced sexual activity: Not on file  Other Topics Concern  . Not on file  Social History  Narrative  . Not on file    Allergies: Allergies  Allergen Reactions  . Ace Inhibitors   . Lasix [Furosemide]   . Lisinopril Cough    Coughed so bad that she had to go to ER.    . Sulfa Antibiotics Hives and Itching    Current Medications: Current Outpatient Medications  Medication Sig Dispense Refill  . amLODipine (NORVASC) 5 MG tablet Take 5 mg by mouth daily.  5  . atorvastatin (LIPITOR) 40 MG tablet Take 40 mg by mouth daily.    . diclofenac sodium (VOLTAREN) 1 % GEL Apply 1 application topically 4 (four) times daily.    Marland Kitchen dicyclomine (BENTYL) 10 MG capsule Take 10 mg by mouth 4 (four) times daily.     Marland Kitchen docusate sodium (COLACE) 100 MG capsule Take 100 mg by mouth 2 (two) times daily.     . Fluticasone-Salmeterol (ADVAIR DISKUS) 100-50 MCG/DOSE AEPB Inhale 1 puff into the lungs 2 (two) times daily as needed (shortness for breath).     . gabapentin (NEURONTIN) 300 MG capsule Take 300 mg by mouth 3 (three) times daily. Takes 300 mg in the morning and 600 mg at bedtime    . losartan (COZAAR) 25 MG tablet Take 25 mg by mouth daily.    Marland Kitchen omeprazole (PRILOSEC) 40 MG capsule Take 40 mg by mouth daily.    . polyethylene glycol (MIRALAX / GLYCOLAX) packet Take 17 g by mouth daily as needed for mild constipation.    . propranolol (INDERAL) 40 MG tablet Take 40 mg by mouth 2 (two) times daily.    Marland Kitchen senna (SENOKOT) 8.6 MG tablet Take 1 tablet by mouth daily.    . SUMAtriptan (IMITREX) 100 MG tablet Take 100 mg by mouth as directed. May repeat in 2 hours if headache persists or recurs.    Marland Kitchen VITAMIN D PO Take 1 capsule by mouth daily.    Marland Kitchen ibuprofen (ADVIL,MOTRIN) 600 MG tablet Take 1 tablet (600 mg total) by mouth every 6 (six) hours as needed. (Patient not taking: Reported on 12/12/2018) 60 tablet 3   No current facility-administered medications for this visit.    ROS Per hpi. Otherwise negative.   Objective:  Physical Examination:  BP 126/83   Pulse 74   Temp 98.1 F (  36.7 C)  (Tympanic)   Resp 18   Ht 5\' 3"  (1.6 m)   Wt 188 lb 14.4 oz (85.7 kg)   BMI 33.46 kg/m    ECOG Performance Status: 1 - Symptomatic but completely ambulatory  GENERAL: Patient is a well appearing female in no acute distress HEENT:  Sclera anicteric. Supple.  NODES:  No cervical, supraclavicular, axillary, or inguinal lymphadenopathy palpated.  LUNGS:  Clear to auscultation bilaterally.  No wheezes or rhonchi. HEART:  Regular rate and rhythm. No murmur appreciated. ABDOMEN:  Soft, nontender.  Positive, normoactive bowel sounds.  EXTREMITIES:  No peripheral edema.   SKIN:  Clear with no obvious rashes or skin changes.  NEURO:  Nonfocal. Well oriented.  Appropriate affect.  Pelvic: EGBUS/Vagina: normal, Bimanual/RV: no masses     Assessment:  Sandra Carrillo is a 58 y.o. female s/p robotic BSO 2/19 for ovarian fibroma and incidental stage 1A sex cord stromal tumor (1.4 cm), steroid (cell) tumor, not otherwise specified. Genetic testing negative for DICER1 mutation.   Chronic back pain with h/o DJD  Remote history prior hysterectomy.  Plan:   Problem List Items Addressed This Visit      Genitourinary   Sex cord stromal tumor - Primary     Based on pathology and [facility] review she appears to have a steroid (cell) tumor, not otherwise specified. The tumor was noted to have an elevated mitotic rate and at least focal tumor necrosis in the form of apoptosis, features which have been associated with more aggressive behavior in these tumors. A reassuring feature, however, is the small 1.4 cm size of the tumor.    AFP, E2, inhibin B, testosterone, and DHEA were checked post op and were not evlevated or in 9/19.Marland Kitchen Routine use of imaging studies is not recommended and will be ordered if recurrence is suspected based on symptoms and exam. She should have follow up in 4 months, but because of insurance issues may do this at [facility] instead of [facility].  Mellody Drown, MD  CC:  Referring Provider:  Dr. Orlando Penner, MD

## 2018-12-13 ENCOUNTER — Telehealth: Payer: Self-pay

## 2018-12-13 NOTE — Telephone Encounter (Signed)
Referral has been sent to [facility] to transfer care for sex cord stromal tumor per patient request. They will contact Sandra Carrillo for scheduling.

## 2019-01-11 ENCOUNTER — Telehealth: Payer: Self-pay

## 2019-01-11 NOTE — Telephone Encounter (Signed)
Confirmed that Sandra Carrillo has been scheduled with [facility], 03/11/19, to establish care for her sex cord stromal tumor.

## 2019-03-13 ENCOUNTER — Other Ambulatory Visit: Payer: Self-pay

## 2019-04-10 ENCOUNTER — Ambulatory Visit: Payer: Self-pay

## 2024-04-13 ENCOUNTER — Ambulatory Visit
Admission: EM | Admit: 2024-04-13 | Discharge: 2024-04-13 | Disposition: A | Discharge status: Home / Self Care | Payer: Self-pay

## 2024-10-20 ENCOUNTER — Ambulatory Visit: Admission: RE | Admit: 2024-10-20 | Discharge: 2024-10-20 | Disposition: A | Discharge status: Home / Self Care

## 2024-10-20 VITALS — BP 106/76 | HR 76 | Temp 98.3°F | Resp 19

## 2024-10-20 DIAGNOSIS — J4531 Mild persistent asthma with (acute) exacerbation: Secondary | ICD-10-CM

## 2024-10-20 DIAGNOSIS — J0100 Acute maxillary sinusitis, unspecified: Secondary | ICD-10-CM | POA: Diagnosis not present

## 2024-10-20 MED ORDER — PREDNISONE 10 MG PO TABS: 20.0000 mg | ORAL_TABLET | Freq: Every day | ORAL | 0 refills | Status: AC

## 2024-10-20 MED ORDER — AZITHROMYCIN 250 MG PO TABS: 250.0000 mg | ORAL_TABLET | Freq: Every day | ORAL | 0 refills | Status: AC

## 2024-10-20 NOTE — Discharge Instructions (Signed)
-  I am concerned that you are developing a sinus infection -Start the z-pack antibiotic. Take for five days -For asthma, start the prednisone, 2 pills taken at the same time for 5 days in a row.  Try taking this earlier in the day as it can give you energy. Avoid NSAIDs like ibuprofen  and alleve while taking this medication as they can increase your risk of stomach upset and even GI bleeding when in combination with a steroid. You can continue tylenol  (acetaminophen ) up to 1000mg  3x daily. -Albuterol inhaler that you have at home already as needed for cough, wheezing, shortness of breath, 1 to 2 puffs every 6 hours as needed. -Continue over-the-counter medications if they are helping -Your cough should slowly get better instead of worse. If you develop a cough productive of dark or red sputum, new shortness of breath, new chest tightness, new fevers, etc - seek additional care.

## 2024-10-20 NOTE — ED Provider Notes (Signed)
 [facility]    CSN: [id number] Arrival date & time: 10/20/24  1112      History   Chief Complaint Chief Complaint  Patient presents with   Cough    Head stopped up chest congestion fever - Entered by patient   Nasal Congestion    HPI Sandra Carrillo is a 63 y.o. female presenting with cough and congestion. Pt being seen in UC for cough, facial and head pressure, and nasal congestion that started on Wednesday (10/16/24). Describes thick nasal congestion and mild sinus pain. Cough is productive of foamy sputum. Pt reports temp of 33F at home. Pt reports taking flonase, mucinex, theraflu, and other otc medication with no relief. History asthma - using albuterol inhaler - has not required today. Pt reports having sick contacts at work. Former smoker.  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   Pelvic mass in female     Past Surgical History:  Procedure Laterality Date    carpal tunnel surgery     ABDOMINAL HYSTERECTOMY     partial 27 years ago   COLONOSCOPY WITH PROPOFOL  N/A 04/11/2018   Procedure: COLONOSCOPY WITH PROPOFOL ;  Surgeon: Viktoria Lamar DASEN, MD;  Location: [facility] ENDOSCOPY;  Service: Endoscopy;  Laterality: N/A;   ESOPHAGOGASTRODUODENOSCOPY (EGD) WITH PROPOFOL  N/A 04/11/2018   Procedure: ESOPHAGOGASTRODUODENOSCOPY (EGD) WITH PROPOFOL ;  Surgeon: Viktoria Lamar DASEN, MD;  Location: [facility] ENDOSCOPY;  Service: Endoscopy;  Laterality: N/A;   ingrown toenail     kidney stone removal Left    at [facility]   LITHOTRIPSY     removal of fibroid     before hyserectomy   ROBOTIC ASSISTED BILATERAL SALPINGO  OOPHERECTOMY Bilateral 01/03/2018   Procedure: ROBOTIC ASSISTED BILATERAL SALPINGO OOPHORECTOMY;  Surgeon: Elby Webb Loges, MD;  Location: [facility] ORS;  Service: Gynecology;  Laterality: Bilateral;   TUBAL LIGATION  1998    OB History   No obstetric history on file.      Home Medications    Prior to Admission medications   Medication Sig Start Date End Date Taking? Authorizing Provider  albuterol (VENTOLIN HFA) 108 (90 Base) MCG/ACT inhaler Inhale 2 puffs into the lungs. 02/09/22  Yes [provider]  azithromycin  (ZITHROMAX  Z-PAK) 250 MG tablet Take 1 tablet (250 mg total) by mouth daily. Two pills (500mg ) day 1. One pill per day (250mg ) days 2-5. 10/20/24  Yes Jasmon Graffam E, PA-C  ferrous sulfate 325 (65 FE) MG tablet Take 325 mg by mouth. 07/31/24  Yes [provider]  montelukast (SINGULAIR) 10 MG tablet Take 10 mg by mouth at bedtime. 04/23/24  Yes [provider]  predniSONE  (DELTASONE ) 10 MG tablet Take 2 tablets (20 mg total) by mouth daily for 5 days. 10/20/24 10/25/24 Yes Oron Westrup E, PA-C  amLODipine (NORVASC) 5 MG tablet Take 5 mg by mouth daily. 11/24/17   [provider]  diclofenac sodium (VOLTAREN) 1 % GEL Apply 1 application topically 4 (four) times daily.    [provider]  docusate sodium (COLACE) 100 MG capsule Take 100 mg by mouth 2 (two) times daily.     [provider]  Fluticasone-Salmeterol (ADVAIR DISKUS) 100-50 MCG/DOSE AEPB Inhale 1 puff into the lungs 2 (two) times daily as needed (shortness for breath).  06/08/15   [provider]  omeprazole (PRILOSEC) 40 MG capsule Take 40 mg by mouth daily.    [provider]  polyethylene glycol (MIRALAX / GLYCOLAX) packet Take 17 g by mouth daily as needed for mild constipation.    [provider]  pregabalin (LYRICA) 50 MG capsule Take 50 mg by mouth 3 (three) times daily.    [provider]  propranolol (INDERAL) 40 MG tablet  Take 40 mg by mouth 2 (two) times daily. 10/10/17 12/12/18  [provider]  rosuvastatin (CRESTOR) 5 MG tablet Take 5 mg by mouth at bedtime.    [provider]  senna (SENOKOT) 8.6 MG tablet Take 1 tablet by mouth daily.    [provider]  VITAMIN D PO Take 1 capsule by mouth daily.    [provider]    Family History Family History  Problem Relation Age of Onset   Leukemia Other 40       daughter of sister   Prostate cancer Maternal Grandfather        deceased 79   Cancer Paternal Uncle        unk. type   Cancer Paternal Aunt        unk. type   Prostate cancer Maternal Uncle    Prostate cancer Maternal Uncle    Breast cancer Neg Hx    Kidney cancer Neg Hx    Kidney disease Neg Hx     Social History Social History   Tobacco Use   Smoking status: Former    Current packs/day: 0.50    Average packs/day: 0.5 packs/day for 20.0 years (10.0 ttl pk-yrs)    Types: Cigarettes   Smokeless tobacco: Never  Vaping Use   Vaping status: Some Days   Substances: Nicotine  Substance Use Topics   Alcohol use: No   Drug use: Not Currently     Allergies   Ace inhibitors, Lasix [furosemide], Lisinopril, and Sulfa antibiotics   Review of Systems Review of Systems  Constitutional:  Negative for appetite change, chills and fever.  HENT:  Positive for congestion and sinus pain. Negative for ear pain, rhinorrhea, sinus pressure and sore throat.   Eyes:  Negative for redness and visual disturbance.  Respiratory:  Positive for cough. Negative for chest tightness, shortness of breath and wheezing.   Cardiovascular:  Negative for chest pain and palpitations.  Gastrointestinal:  Negative for abdominal pain, constipation, diarrhea, nausea and vomiting.  Genitourinary:  Negative for dysuria, frequency and urgency.  Musculoskeletal:  Negative for myalgias.  Neurological:  Negative for dizziness, weakness and headaches.  Psychiatric/Behavioral:  Negative  for confusion.   All other systems reviewed and are negative.    Physical Exam Triage Vital Signs ED Triage Vitals [10/20/24 1133]  Encounter Vitals Group     BP      Girls Systolic BP Percentile      Girls Diastolic BP Percentile      Boys Systolic BP Percentile      Boys Diastolic BP Percentile      Pulse      Resp      Temp      Temp src      SpO2  Weight      Height      Head Circumference      Peak Flow      Pain Score 7     Pain Loc      Pain Education      Exclude from Growth Chart    No data found.  Updated Vital Signs BP 106/76 (BP Location: Right Arm)   Pulse 76   Temp 98.3 F (36.8 C) (Oral)   Resp 19   SpO2 97%   Visual Acuity Right Eye Distance:   Left Eye Distance:   Bilateral Distance:    Right Eye Near:   Left Eye Near:    Bilateral Near:     Physical Exam Vitals reviewed.  Constitutional:      General: She is not in acute distress.    Appearance: Normal appearance. She is not ill-appearing.  HENT:     Head: Normocephalic and atraumatic.     Right Ear: Tympanic membrane, ear canal and external ear normal. No tenderness. No middle ear effusion. There is no impacted cerumen. Tympanic membrane is not perforated, erythematous, retracted or bulging.     Left Ear: Tympanic membrane, ear canal and external ear normal. No tenderness.  No middle ear effusion. There is no impacted cerumen. Tympanic membrane is not perforated, erythematous, retracted or bulging.     Nose: No congestion.     Right Sinus: Maxillary sinus tenderness present. No frontal sinus tenderness.     Left Sinus: Maxillary sinus tenderness present. No frontal sinus tenderness.     Mouth/Throat:     Mouth: Mucous membranes are moist.     Pharynx: Uvula midline. No oropharyngeal exudate or posterior oropharyngeal erythema.     Tonsils: No tonsillar exudate.  Eyes:     Extraocular Movements: Extraocular movements intact.     Pupils: Pupils are equal, round, and reactive to  light.  Cardiovascular:     Rate and Rhythm: Normal rate and regular rhythm.     Heart sounds: Normal heart sounds.  Pulmonary:     Effort: Pulmonary effort is normal.     Breath sounds: Normal breath sounds. No decreased breath sounds, wheezing, rhonchi or rales.  Abdominal:     Palpations: Abdomen is soft.     Tenderness: There is no abdominal tenderness. There is no guarding or rebound.  Lymphadenopathy:     Cervical: No cervical adenopathy.     Right cervical: No superficial, deep or posterior cervical adenopathy.    Left cervical: No superficial, deep or posterior cervical adenopathy.  Skin:    Comments: No rash   Neurological:     General: No focal deficit present.     Mental Status: She is alert and oriented to person, place, and time.  Psychiatric:        Mood and Affect: Mood normal.        Behavior: Behavior normal.        Thought Content: Thought content normal.        Judgment: Judgment normal.      UC Treatments / Results  Labs (all labs ordered are listed, but only abnormal results are displayed) Labs Reviewed - No data to display  EKG   Radiology No results found.  Procedures Procedures (including critical care time)  Medications Ordered in UC Medications - No data to display  Initial Impression / Assessment and Plan / UC Course  I have reviewed the triage vital signs and the nursing notes.  Pertinent labs &  imaging results that were available during my care of the patient were reviewed by me and considered in my medical decision making (see chart for details).     Patient is a pleasant 63 year old female presenting with sinusitis. The patient is afebrile and nontachycardic.  Antipyretic has not been administered today. H/o asthma; former smoker.   Did not check a COVID or influenza test due to duration of symptoms.  For sinusitis and asthma exacerbation, will manage with Z-Pak, low-dose prednisone.  Continue albuterol inhaler that she has at  home already.  Based on her exam today, I am not concerned for pneumonia, though we are covering for this with the Z-Pak.  Return precautions as below.  Final Clinical Impressions(s) / UC Diagnoses   Final diagnoses:  Acute non-recurrent maxillary sinusitis  Mild persistent asthma with acute exacerbation     Discharge Instructions      -I am concerned that you are developing a sinus infection -Start the z-pack antibiotic. Take for five days -For asthma, start the prednisone, 2 pills taken at the same time for 5 days in a row.  Try taking this earlier in the day as it can give you energy. Avoid NSAIDs like ibuprofen  and alleve while taking this medication as they can increase your risk of stomach upset and even GI bleeding when in combination with a steroid. You can continue tylenol  (acetaminophen ) up to 1000mg  3x daily. -Albuterol inhaler that you have at home already as needed for cough, wheezing, shortness of breath, 1 to 2 puffs every 6 hours as needed. -Continue over-the-counter medications if they are helping -Your cough should slowly get better instead of worse. If you develop a cough productive of dark or red sputum, new shortness of breath, new chest tightness, new fevers, etc - seek additional care.       ED Prescriptions     Medication Sig Dispense Auth. Provider   azithromycin (ZITHROMAX Z-PAK) 250 MG tablet Take 1 tablet (250 mg total) by mouth daily. Two pills (500mg ) day 1. One pill per day (250mg ) days 2-5. 6 tablet Bresha Hosack E, PA-C   predniSONE (DELTASONE) 10 MG tablet Take 2 tablets (20 mg total) by mouth daily for 5 days. 10 tablet Breon Diss E, PA-C      PDMP not reviewed this encounter.   Arlyss Leita BRAVO, PA-C 10/20/24 1217

## 2024-10-20 NOTE — ED Triage Notes (Signed)
 Pt being seen in UC for cough, facial and head pressure, and nasal congestion that started on Wednesday. Pt reports temp of 55F at home. Pt reports taking flonase, mucinex, theraflu, and other otc medication with no relief. Pt reports having sick contacts at work.
